# Patient Record
Sex: Female | Born: 1975 | Race: Black or African American | Hispanic: No | Marital: Married | Smoking: Never smoker
Health system: Southern US, Community
[De-identification: ages and names within clinical notes are randomized; demographics above are authoritative.]

## PROBLEM LIST (undated history)

## (undated) DIAGNOSIS — N63 Unspecified lump in unspecified breast: Secondary | ICD-10-CM

## (undated) HISTORY — PX: ECTOPIC PREGNANCY SURGERY: SHX613

## (undated) HISTORY — PX: BREAST EXCISIONAL BIOPSY: SUR124

## (undated) HISTORY — PX: BREAST SURGERY: SHX581

---

## 2001-08-29 ENCOUNTER — Encounter: Payer: Self-pay | Admitting: Family Medicine

## 2001-08-29 ENCOUNTER — Ambulatory Visit (HOSPITAL_COMMUNITY): Admission: RE | Admit: 2001-08-29 | Discharge: 2001-08-29 | Payer: Self-pay | Admitting: Family Medicine

## 2001-10-17 ENCOUNTER — Encounter (HOSPITAL_COMMUNITY): Admission: RE | Admit: 2001-10-17 | Discharge: 2001-11-16 | Payer: Self-pay | Admitting: Family Medicine

## 2001-12-21 ENCOUNTER — Encounter: Payer: Self-pay | Admitting: Internal Medicine

## 2001-12-21 ENCOUNTER — Ambulatory Visit (HOSPITAL_COMMUNITY): Admission: RE | Admit: 2001-12-21 | Discharge: 2001-12-21 | Payer: Self-pay | Admitting: Internal Medicine

## 2007-08-16 ENCOUNTER — Ambulatory Visit (HOSPITAL_COMMUNITY): Admission: RE | Admit: 2007-08-16 | Discharge: 2007-08-16 | Payer: Self-pay | Admitting: Family Medicine

## 2010-04-12 ENCOUNTER — Encounter: Payer: Self-pay | Admitting: Oral Surgery

## 2011-06-07 ENCOUNTER — Other Ambulatory Visit (HOSPITAL_COMMUNITY): Payer: Self-pay | Admitting: Family Medicine

## 2011-06-07 DIAGNOSIS — Z139 Encounter for screening, unspecified: Secondary | ICD-10-CM

## 2011-06-14 ENCOUNTER — Ambulatory Visit (HOSPITAL_COMMUNITY): Payer: Self-pay

## 2011-06-21 ENCOUNTER — Inpatient Hospital Stay (HOSPITAL_COMMUNITY): Admission: RE | Admit: 2011-06-21 | Payer: Self-pay | Source: Ambulatory Visit

## 2013-03-30 ENCOUNTER — Encounter (HOSPITAL_COMMUNITY): Payer: Self-pay | Admitting: Emergency Medicine

## 2013-03-30 ENCOUNTER — Emergency Department (HOSPITAL_COMMUNITY)
Admission: EM | Admit: 2013-03-30 | Discharge: 2013-03-30 | Disposition: A | Discharge status: Home / Self Care | Payer: BC Managed Care – PPO | Attending: Emergency Medicine | Admitting: Emergency Medicine

## 2013-03-30 DIAGNOSIS — R21 Rash and other nonspecific skin eruption: Secondary | ICD-10-CM | POA: Insufficient documentation

## 2013-03-30 DIAGNOSIS — IMO0001 Reserved for inherently not codable concepts without codable children: Secondary | ICD-10-CM | POA: Insufficient documentation

## 2013-03-30 MED ORDER — FAMOTIDINE 20 MG PO TABS: 20.0000 mg | ORAL_TABLET | Freq: Two times a day (BID) | ORAL | Status: DC

## 2013-03-30 MED ORDER — IBUPROFEN 800 MG PO TABS
800.0000 mg | ORAL_TABLET | Freq: Once | ORAL | Status: AC
  Administered 2013-03-30: 800 mg via ORAL
  Filled 2013-03-30: qty 1

## 2013-03-30 MED ORDER — FAMOTIDINE 20 MG PO TABS
20.0000 mg | ORAL_TABLET | Freq: Once | ORAL | Status: AC
  Administered 2013-03-30: 20 mg via ORAL
  Filled 2013-03-30: qty 1

## 2013-03-30 MED ORDER — DIPHENHYDRAMINE HCL 25 MG PO TABS: 25.0000 mg | ORAL_TABLET | Freq: Four times a day (QID) | ORAL | Status: DC | PRN

## 2013-03-30 MED ORDER — HYDROCORTISONE 1 % EX CREA: 1.0000 "application " | TOPICAL_CREAM | Freq: Two times a day (BID) | CUTANEOUS | Status: DC

## 2013-03-30 MED ORDER — IBUPROFEN 600 MG PO TABS: 600.0000 mg | ORAL_TABLET | Freq: Four times a day (QID) | ORAL | Status: DC | PRN

## 2013-03-30 NOTE — ED Notes (Signed)
Pt reports left medial ankle pain that came on suddenly last night. Pt has three welts on medial ankle that are inflamed. Pt denies injuring ankle.

## 2013-03-30 NOTE — ED Provider Notes (Signed)
Medical screening examination/treatment/procedure(s) were performed by non-physician practitioner and as supervising physician I was immediately available for consultation/collaboration.   Dione Boozeavid Earon Rivest, MD 03/30/13 0300

## 2013-03-30 NOTE — Discharge Instructions (Signed)
Recommend Benadryl, Zantac, and ibuprofen as prescribed. You may use hydrocortisone topically for symptoms. Followup with your primary care doctor. Return if symptoms worsen.  Rash A rash is a change in the color or texture of your skin. There are many different types of rashes. You may have other problems that accompany your rash. CAUSES   Infections.  Allergic reactions. This can include allergies to pets or foods.  Certain medicines.  Exposure to certain chemicals, soaps, or cosmetics.  Heat.  Exposure to poisonous plants.  Tumors, both cancerous and noncancerous. SYMPTOMS   Redness.  Scaly skin.  Itchy skin.  Dry or cracked skin.  Bumps.  Blisters.  Pain. DIAGNOSIS  Your caregiver may do a physical exam to determine what type of rash you have. A skin sample (biopsy) may be taken and examined under a microscope. TREATMENT  Treatment depends on the type of rash you have. Your caregiver may prescribe certain medicines. For serious conditions, you may need to see a skin doctor (dermatologist). HOME CARE INSTRUCTIONS   Avoid the substance that caused your rash.  Do not scratch your rash. This can cause infection.  You may take cool baths to help stop itching.  Only take over-the-counter or prescription medicines as directed by your caregiver.  Keep all follow-up appointments as directed by your caregiver. SEEK IMMEDIATE MEDICAL CARE IF:  You have increasing pain, swelling, or redness.  You have a fever.  You have new or severe symptoms.  You have body aches, diarrhea, or vomiting.  Your rash is not better after 3 days. MAKE SURE YOU:  Understand these instructions.  Will watch your condition.  Will get help right away if you are not doing well or get worse. Document Released: 02/26/2002 Document Revised: 05/31/2011 Document Reviewed: 12/21/2010 [facility] Patient Information 2014 [employer], [place].

## 2013-03-30 NOTE — ED Provider Notes (Signed)
CSN: [id number]     Arrival date & time 03/30/13  0139 History   First MD Initiated Contact with Patient 03/30/13 0142     Chief Complaint  Patient presents with  . Ankle Pain   (Consider location/radiation/quality/duration/timing/severity/associated sxs/prior Treatment) HPI Comments: Soreness to medial L ankle with associated mild erythema without linear streaking. No induration or injury to the area. Patient denies fever, swelling of ankle or foot, claudication, numbness/tingling, pallor, and weakness.  Patient is a 38 y.o. female presenting with ankle pain. The history is provided by the patient. No language interpreter was used.  Ankle Pain Location:  Ankle Time since incident:  6 hours Injury: no   Ankle location:  L ankle Pain details:    Quality:  Aching ("soreness")   Radiates to:  Does not radiate   Severity:  Mild   Onset quality:  Gradual   Timing:  Intermittent   Progression:  Unchanged Chronicity:  New Dislocation: no   Prior injury to area:  No Relieved by:  Acetaminophen Exacerbated by: palpation to area. Associated symptoms: no decreased ROM, no fatigue, no fever, no itching, no muscle weakness, no numbness, no stiffness, no swelling and no tingling   Risk factors: no known bone disorder and no obesity     History reviewed. No pertinent past medical history. Past Surgical History  Procedure Laterality Date  . Ectopic pregnancy surgery     No family history on file. History  Substance Use Topics  . Smoking status: Never Smoker   . Smokeless tobacco: Not on file  . Alcohol Use: No   OB History   Grav Para Term Preterm Abortions TAB SAB Ect Mult Living                 Review of Systems  Constitutional: Negative for fever and fatigue.  Musculoskeletal: Positive for myalgias. Negative for joint swelling and stiffness.  Skin: Positive for rash. Negative for itching and pallor.  Neurological: Negative for weakness and numbness.  All other systems reviewed  and are negative.    Allergies  Codeine  Home Medications   Current Outpatient Rx  Name  Route  Sig  Dispense  Refill  . diphenhydrAMINE (BENADRYL) 25 MG tablet   Oral   Take 1 tablet (25 mg total) by mouth every 6 (six) hours as needed for itching (Rash).   30 tablet   0   . famotidine (PEPCID) 20 MG tablet   Oral   Take 1 tablet (20 mg total) by mouth 2 (two) times daily.   30 tablet   0   . hydrocortisone cream 1 %   Topical   Apply 1 application topically 2 (two) times daily. Do not apply to face   15 g   1   . ibuprofen (ADVIL,MOTRIN) 600 MG tablet   Oral   Take 1 tablet (600 mg total) by mouth every 6 (six) hours as needed.   30 tablet   0    BP 115/67  Pulse 101  Temp(Src) 98.5 F (36.9 C) (Oral)  Resp 20  SpO2 100%  LMP 03/15/2013 Physical Exam  Nursing note and vitals reviewed. Constitutional: She is oriented to person, place, and time. She appears well-developed and well-nourished. No distress.  HENT:  Head: Normocephalic and atraumatic.  Eyes: Conjunctivae and EOM are normal. No scleral icterus.  Neck: Normal range of motion.  Cardiovascular: Normal rate, regular rhythm and intact distal pulses.   Pulses:      Dorsalis pedis  pulses are 2+ on the right side, and 2+ on the left side.       Posterior tibial pulses are 2+ on the right side, and 2+ on the left side.  Pulmonary/Chest: Effort normal. No respiratory distress.  Musculoskeletal: Normal range of motion. She exhibits tenderness.       Left ankle: She exhibits normal range of motion, no swelling, no ecchymosis, no deformity, no laceration and normal pulse. Achilles tendon normal.       Left lower leg: Normal.       Left foot: Normal.       Feet:  Neurological: She is alert and oriented to person, place, and time.  No numbness, tingling or weakness of the affected extremity  Skin: Skin is warm and dry. No rash noted. She is not diaphoretic. No erythema. No pallor.  Psychiatric: She has a  normal mood and affect. Her behavior is normal.    ED Course  Procedures (including critical care time) Labs Review Labs Reviewed - No data to display Imaging Review No results found.  EKG Interpretation   None       MDM   1. Rash    Uncomplicated sore rash to medial left ankle. Patient well and nontoxic appearing, hemodynamically stable, and afebrile. Patient denies any new topical contacts as well as antibiotic use. She is neurovascularly intact on physical exam with normal range of motion of L ankle. No swelling of the left lower extremity, ankle, or foot. Sensation intact bilaterally. Rash non-concerning for SJS, erythema multiforme major, and erythema multiforme a minor. Patient stable for discharge with instructions for primary care followup. Return precautions discussed and patient agreeable to plan with no unaddressed concerns.    Antonietta Breach, PA-C 03/30/13 0230

## 2013-03-30 NOTE — ED Notes (Signed)
Pt. reports pain at left inner ankle ( " red spots" ) onset yesterday , denies injury , ambulatory .

## 2013-09-18 ENCOUNTER — Encounter (HOSPITAL_COMMUNITY): Payer: Self-pay

## 2013-09-18 ENCOUNTER — Other Ambulatory Visit (HOSPITAL_COMMUNITY): Payer: Self-pay | Admitting: Internal Medicine

## 2013-09-18 ENCOUNTER — Ambulatory Visit (HOSPITAL_COMMUNITY)
Admission: RE | Admit: 2013-09-18 | Discharge: 2013-09-18 | Disposition: A | Discharge status: Home / Self Care | Payer: BC Managed Care – PPO | Source: Ambulatory Visit | Attending: Internal Medicine | Admitting: Internal Medicine

## 2013-09-18 DIAGNOSIS — R0602 Shortness of breath: Secondary | ICD-10-CM | POA: Insufficient documentation

## 2013-09-18 DIAGNOSIS — R079 Chest pain, unspecified: Secondary | ICD-10-CM

## 2013-09-18 DIAGNOSIS — N200 Calculus of kidney: Secondary | ICD-10-CM | POA: Insufficient documentation

## 2013-09-18 MED ORDER — IOHEXOL 350 MG/ML SOLN
100.0000 mL | Freq: Once | INTRAVENOUS | Status: AC | PRN
  Administered 2013-09-18: 100 mL via INTRAVENOUS

## 2013-10-29 ENCOUNTER — Other Ambulatory Visit (HOSPITAL_COMMUNITY): Payer: Self-pay | Admitting: Family Medicine

## 2013-10-29 DIAGNOSIS — R928 Other abnormal and inconclusive findings on diagnostic imaging of breast: Secondary | ICD-10-CM

## 2013-11-07 ENCOUNTER — Ambulatory Visit (HOSPITAL_COMMUNITY)
Admission: RE | Admit: 2013-11-07 | Discharge: 2013-11-07 | Disposition: A | Discharge status: Home / Self Care | Payer: BC Managed Care – PPO | Source: Ambulatory Visit | Attending: Family Medicine | Admitting: Family Medicine

## 2013-11-07 ENCOUNTER — Other Ambulatory Visit (HOSPITAL_COMMUNITY): Payer: Self-pay | Admitting: Family Medicine

## 2013-11-07 DIAGNOSIS — R059 Cough, unspecified: Secondary | ICD-10-CM | POA: Diagnosis not present

## 2013-11-07 DIAGNOSIS — R05 Cough: Secondary | ICD-10-CM | POA: Diagnosis not present

## 2013-11-07 DIAGNOSIS — J209 Acute bronchitis, unspecified: Secondary | ICD-10-CM

## 2013-11-07 DIAGNOSIS — R0602 Shortness of breath: Secondary | ICD-10-CM | POA: Insufficient documentation

## 2013-11-13 ENCOUNTER — Encounter (HOSPITAL_COMMUNITY): Payer: BC Managed Care – PPO

## 2013-11-20 ENCOUNTER — Ambulatory Visit (HOSPITAL_COMMUNITY)
Admission: RE | Admit: 2013-11-20 | Discharge: 2013-11-20 | Disposition: A | Discharge status: Home / Self Care | Payer: BC Managed Care – PPO | Source: Ambulatory Visit | Attending: Family Medicine | Admitting: Family Medicine

## 2013-11-20 DIAGNOSIS — N63 Unspecified lump in unspecified breast: Secondary | ICD-10-CM | POA: Diagnosis present

## 2013-11-20 DIAGNOSIS — R928 Other abnormal and inconclusive findings on diagnostic imaging of breast: Secondary | ICD-10-CM

## 2014-03-22 HISTORY — PX: AUGMENTATION MAMMAPLASTY: SUR837

## 2014-04-18 ENCOUNTER — Ambulatory Visit (HOSPITAL_COMMUNITY)
Admission: RE | Admit: 2014-04-18 | Discharge: 2014-04-18 | Disposition: A | Discharge status: Home / Self Care | Payer: BLUE CROSS/BLUE SHIELD | Source: Ambulatory Visit | Attending: Physician Assistant | Admitting: Physician Assistant

## 2014-04-18 ENCOUNTER — Other Ambulatory Visit (HOSPITAL_COMMUNITY): Payer: Self-pay | Admitting: Physician Assistant

## 2014-04-18 DIAGNOSIS — J209 Acute bronchitis, unspecified: Secondary | ICD-10-CM

## 2014-04-18 DIAGNOSIS — R0989 Other specified symptoms and signs involving the circulatory and respiratory systems: Secondary | ICD-10-CM | POA: Diagnosis not present

## 2014-04-18 DIAGNOSIS — R05 Cough: Secondary | ICD-10-CM | POA: Insufficient documentation

## 2014-04-18 DIAGNOSIS — R0602 Shortness of breath: Secondary | ICD-10-CM | POA: Diagnosis not present

## 2015-05-05 ENCOUNTER — Encounter (HOSPITAL_COMMUNITY): Payer: Self-pay | Admitting: Emergency Medicine

## 2015-05-05 ENCOUNTER — Emergency Department (HOSPITAL_COMMUNITY)
Admission: EM | Admit: 2015-05-05 | Discharge: 2015-05-06 | Disposition: A | Discharge status: Home / Self Care | Payer: BLUE CROSS/BLUE SHIELD | Attending: Emergency Medicine | Admitting: Emergency Medicine

## 2015-05-05 DIAGNOSIS — M79671 Pain in right foot: Secondary | ICD-10-CM

## 2015-05-05 DIAGNOSIS — Z7952 Long term (current) use of systemic steroids: Secondary | ICD-10-CM | POA: Insufficient documentation

## 2015-05-05 DIAGNOSIS — Z79899 Other long term (current) drug therapy: Secondary | ICD-10-CM | POA: Insufficient documentation

## 2015-05-05 MED ORDER — NAPROXEN 500 MG PO TABS: 500.0000 mg | ORAL_TABLET | Freq: Two times a day (BID) | ORAL | Status: DC

## 2015-05-05 NOTE — ED Provider Notes (Signed)
CSN: [id number]     Arrival date & time 05/05/15  2256 History  By signing my name below, I, Elon Spanner, attest that this documentation has been prepared under the direction and in the presence of Felicie Morn, PA-C. Electronically Signed: Elon Spanner ED Scribe. 05/05/2015. 11:53 PM.    Chief Complaint  Patient presents with  . Foot Pain   The history is provided by the patient. No language interpreter was used.   HPI Comments: Alison Thornton is a 40 y.o. female with no chronic conditions who presents to the Emergency Department complaining of improving pain in the dorsal surface of the foot onset yesterday while walking and without injury.  She reports tingling radiation from the complaint began to develop earlier today which goes into the mid-calf.  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 She denies calf pain, calf tenderness, calf swelling.  Patient takes no medications regularly.     PCP: Colette Ribas, MD  History reviewed. No pertinent past medical history. Past Surgical History  Procedure Laterality Date  . Ectopic pregnancy surgery    . Breast surgery     No family history on file. Social History  Substance Use Topics  . Smoking status: Never Smoker   . Smokeless tobacco: None  . Alcohol Use: No   OB History    No data available     Review of Systems  Musculoskeletal: Positive for arthralgias.  All other systems reviewed and are negative.  A complete 10 system review of systems was obtained and all systems are negative except as noted in the HPI and PMH.   Allergies  Codeine  Home Medications   Prior to Admission medications   Medication Sig Start Date End Date Taking? Authorizing Provider  diphenhydrAMINE (BENADRYL) 25 MG tablet Take 1 tablet (25 mg total) by mouth every 6 (six) hours as needed for itching (Rash). 03/30/13   Antony Madura, PA-C  famotidine (PEPCID) 20 MG tablet Take 1 tablet (20 mg total) by mouth  2 (two) times daily. 03/30/13   Antony Madura, PA-C  hydrocortisone cream 1 % Apply 1 application topically 2 (two) times daily. Do not apply to face 03/30/13   Antony Madura, PA-C  ibuprofen (ADVIL,MOTRIN) 600 MG tablet Take 1 tablet (600 mg total) by mouth every 6 (six) hours as needed. 03/30/13   Antony Madura, PA-C   BP 111/79 mmHg  Pulse 77  Temp(Src) 99 F (37.2 C) (Oral)  Resp 16  Ht 5' 8.5" (1.74 m)  Wt 169 lb (76.658 kg)  BMI 25.32 kg/m2  SpO2 100%  LMP 05/04/2015 Physical Exam  Constitutional: She is oriented to person, place, and time. She appears well-developed and well-nourished. No distress.  HENT:  Head: Normocephalic and atraumatic.  Eyes: Conjunctivae and EOM are normal.  Neck: Neck supple. No tracheal deviation present.  Cardiovascular: Normal rate.   Pulmonary/Chest: Effort normal. No respiratory distress.  Musculoskeletal: Normal range of motion.  Mild tenderness over the dorsal aspect of the foot across the medial metatarsals.  No calf pain, swelling, or redness.    Neurological: She is alert and oriented to person, place, and time.  Skin: Skin is warm and dry.  Psychiatric: She has a normal mood and affect. Her behavior is normal.  Nursing note and vitals reviewed.   ED Course  Procedures (including critical care time)  DIAGNOSTIC STUDIES: Oxygen Saturation is 100% on RA, normal by my interpretation.    COORDINATION  OF CARE:  11:53 PM Patient advised to RICE and use ibuprofen as needed.  Return precautions advised. Patient acknowledges and agrees with plan.    Labs Review Labs Reviewed - No data to display  Imaging Review No results found. I have personally reviewed and evaluated these images and lab results as part of my medical decision-making.   EKG Interpretation None     Dorsal foot pain without swelling or erythema. 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  No calf pain or tenderness.  MDM   Final  diagnoses:  None    Foot pain. Anti-inflammatory. PCP follow-up. Care instructions provided. Return precautions discussed..  I personally performed the services described in this documentation, which was scribed in my presence. The recorded information has been reviewed and is accurate.    Felicie Morn, NP 05/06/15 [id number]  Gilda Crease, MD 05/06/15 [phone]

## 2015-05-05 NOTE — Discharge Instructions (Signed)

## 2015-05-05 NOTE — ED Notes (Signed)
Pt. reports right foot pain radiating to right calf onset last night , denies injury/ ambulatory .

## 2015-12-16 ENCOUNTER — Encounter: Payer: Self-pay | Admitting: Cardiology

## 2016-01-09 ENCOUNTER — Ambulatory Visit (INDEPENDENT_AMBULATORY_CARE_PROVIDER_SITE_OTHER): Payer: BLUE CROSS/BLUE SHIELD | Admitting: Cardiovascular Disease

## 2016-01-09 ENCOUNTER — Encounter: Payer: Self-pay | Admitting: Cardiovascular Disease

## 2016-01-09 VITALS — BP 104/68 | HR 87 | Ht 68.5 in | Wt 181.0 lb

## 2016-01-09 DIAGNOSIS — R5383 Other fatigue: Secondary | ICD-10-CM

## 2016-01-09 DIAGNOSIS — R0789 Other chest pain: Secondary | ICD-10-CM

## 2016-01-09 DIAGNOSIS — R0602 Shortness of breath: Secondary | ICD-10-CM | POA: Diagnosis not present

## 2016-01-09 DIAGNOSIS — R079 Chest pain, unspecified: Secondary | ICD-10-CM

## 2016-01-09 NOTE — Patient Instructions (Signed)
Your physician recommends that you schedule a follow-up appointment in: 6-8 Weeks with Dr. Purvis SheffieldKoneswaran  Your physician recommends that you continue on your current medications as directed. Please refer to the Current Medication list given to you today.  Your physician has requested that you have an exercise tolerance test. For further information please visit [email or web address]. Please also follow instruction sheet, as given.   Your physician has requested that you have an echocardiogram. Echocardiography is a painless test that uses sound waves to create images of your heart. It provides your doctor with information about the size and shape of your heart and how well your heart's chambers and valves are working. This procedure takes approximately one hour. There are no restrictions for this procedure.  If you need a refill on your cardiac medications before your next appointment, please call your pharmacy.  Thank you for choosing [facility]!

## 2016-01-09 NOTE — Progress Notes (Signed)
CARDIOLOGY CONSULT NOTE  Patient ID: Alison CanardCasmin J Whitmyer MRN: [id number] DOB/AGE: 41/10/1975 41 y.o.  Admit date: (Not on file) Primary Physician: Colette RibasGOLDING, JOHN CABOT, MD Referring Physician:   Reason for Consultation: SOB  HPI: 40 yr old woman with no significant past medical history referred for evaluation of SOB and fatigue.  Labs 10/03/15: Hgb 12, Na 139, BUN 14, creatinine 0.69, TC 257, TG 49, HDL 107, LDL 140.  Started becoming short of breath 04/2014. Her husband was killed on 04/22/14. Treated for bronchitis with prednisone.  Has been experiencing progressive exertional dyspnea, particularly while climbing stairs over the past 3 months.  Denies exertional chest pain, orthopnea, leg swelling, palpitations, syncope, and PND.  Does have chest pain while lying down. Her children have GERD.  PCP started Symbicort for SOB which has helped.  ECG performed in the office today which I personally reviewed demonstrates normal sinus rhythm with no ischemic ST segment or T-wave abnormalities, nor any arrhythmias.     Allergies  Allergen Reactions  . Codeine     No current outpatient prescriptions on file.   No current facility-administered medications for this visit.     History reviewed. No pertinent past medical history.  Past Surgical History:  Procedure Laterality Date  . BREAST SURGERY    . ECTOPIC PREGNANCY SURGERY      Social History   Social History  . Marital status: Married    Spouse name: N/A  . Number of children: N/A  . Years of education: N/A   Occupational History  . Not on file.   Social History Main Topics  . Smoking status: Never Smoker  . Smokeless tobacco: Never Used  . Alcohol use No  . Drug use: No  . Sexual activity: Yes    Partners: Male   Other Topics Concern  . Not on file   Social History Narrative  . No narrative on file     No family history of premature CAD in 1st degree relatives.  Prior to Admission  medications   Medication Sig Start Date End Date Taking? Authorizing Provider  diphenhydrAMINE (BENADRYL) 25 MG tablet Take 1 tablet (25 mg total) by mouth every 6 (six) hours as needed for itching (Rash). 03/30/13   Antony MaduraKelly Humes, PA-C  famotidine (PEPCID) 20 MG tablet Take 1 tablet (20 mg total) by mouth 2 (two) times daily. 03/30/13   Antony MaduraKelly Humes, PA-C  hydrocortisone cream 1 % Apply 1 application topically 2 (two) times daily. Do not apply to face 03/30/13   Antony MaduraKelly Humes, PA-C  ibuprofen (ADVIL,MOTRIN) 600 MG tablet Take 1 tablet (600 mg total) by mouth every 6 (six) hours as needed. 03/30/13   Antony MaduraKelly Humes, PA-C  naproxen (NAPROSYN) 500 MG tablet Take 1 tablet (500 mg total) by mouth 2 (two) times daily. 05/05/15   Felicie Mornavid Smith, NP     Review of systems complete and found to be negative unless listed above in HPI     Physical exam Blood pressure 104/68, pulse 87, height 5' 8.5" (1.74 m), weight 181 lb (82.1 kg), SpO2 96 %. General: NAD Neck: No JVD, no thyromegaly or thyroid nodule.  Lungs: Clear to auscultation bilaterally with normal respiratory effort. CV: Nondisplaced PMI. Regular rate and rhythm, normal S1/S2, no S3/S4, no murmur.  No peripheral edema.  No carotid bruit.  Abdomen: Soft, nontender, no hepatosplenomegaly, no distention.  Skin: Intact without lesions or rashes.  Neurologic: Alert and oriented x 3.  Psych: Normal affect.  Extremities: No clubbing or cyanosis.  HEENT: Normal.   ECG: Most recent ECG reviewed.  Labs:  No results found for: WBC, HGB, HCT, MCV, PLT No results for input(s): NA, K, CL, CO2, BUN, CREATININE, CALCIUM, PROT, BILITOT, ALKPHOS, ALT, AST, GLUCOSE in the last 168 hours.  Invalid input(s): LABALBU No results found for: CKTOTAL, CKMB, CKMBINDEX, TROPONINI No results found for: CHOL No results found for: HDL No results found for: LDLCALC No results found for: TRIG No results found for: CHOLHDL No results found for: LDLDIRECT       Studies: No  results found.  ASSESSMENT AND PLAN:  1. SOB/fatigue: I will order a 2-D echocardiogram with Doppler to evaluate cardiac structure, function, and regional wall motion. I will proceed with an exercise treadmill stress test to evaluate for ischemic heart disease.  2. Chest pain: Symptoms occur while lying down. Possibly related to GERD.  Dispo: fu 6-8 wks  Signed: Prentice Docker, M.D., F.A.C.C.  01/09/2016, 9:30 AM

## 2016-01-14 ENCOUNTER — Ambulatory Visit (HOSPITAL_COMMUNITY): Payer: BLUE CROSS/BLUE SHIELD

## 2016-01-14 ENCOUNTER — Ambulatory Visit (HOSPITAL_COMMUNITY): Admission: RE | Admit: 2016-01-14 | Payer: BLUE CROSS/BLUE SHIELD | Source: Ambulatory Visit

## 2016-02-02 ENCOUNTER — Encounter (HOSPITAL_COMMUNITY): Payer: BLUE CROSS/BLUE SHIELD

## 2016-02-09 ENCOUNTER — Inpatient Hospital Stay (HOSPITAL_COMMUNITY): Admission: RE | Admit: 2016-02-09 | Payer: BLUE CROSS/BLUE SHIELD | Source: Ambulatory Visit

## 2016-02-09 ENCOUNTER — Other Ambulatory Visit (HOSPITAL_COMMUNITY): Payer: BLUE CROSS/BLUE SHIELD

## 2016-02-17 ENCOUNTER — Encounter (HOSPITAL_COMMUNITY): Payer: BLUE CROSS/BLUE SHIELD

## 2016-02-17 ENCOUNTER — Other Ambulatory Visit (HOSPITAL_COMMUNITY): Payer: BLUE CROSS/BLUE SHIELD

## 2016-02-18 ENCOUNTER — Inpatient Hospital Stay (HOSPITAL_COMMUNITY): Admission: RE | Admit: 2016-02-18 | Payer: BLUE CROSS/BLUE SHIELD | Source: Ambulatory Visit

## 2016-02-18 ENCOUNTER — Ambulatory Visit (HOSPITAL_COMMUNITY): Payer: BLUE CROSS/BLUE SHIELD | Attending: Cardiovascular Disease

## 2016-03-01 ENCOUNTER — Ambulatory Visit (HOSPITAL_COMMUNITY): Payer: BLUE CROSS/BLUE SHIELD

## 2016-03-05 ENCOUNTER — Ambulatory Visit: Payer: BLUE CROSS/BLUE SHIELD | Admitting: Cardiovascular Disease

## 2016-07-13 ENCOUNTER — Other Ambulatory Visit: Payer: Self-pay | Admitting: Obstetrics & Gynecology

## 2016-07-13 DIAGNOSIS — N6310 Unspecified lump in the right breast, unspecified quadrant: Secondary | ICD-10-CM

## 2016-07-20 ENCOUNTER — Ambulatory Visit
Admission: RE | Admit: 2016-07-20 | Discharge: 2016-07-20 | Disposition: A | Discharge status: Home / Self Care | Payer: No Typology Code available for payment source | Source: Ambulatory Visit | Attending: Obstetrics & Gynecology | Admitting: Obstetrics & Gynecology

## 2016-07-20 ENCOUNTER — Other Ambulatory Visit: Payer: Self-pay | Admitting: Obstetrics & Gynecology

## 2016-07-20 DIAGNOSIS — N6310 Unspecified lump in the right breast, unspecified quadrant: Secondary | ICD-10-CM

## 2016-07-20 DIAGNOSIS — N63 Unspecified lump in unspecified breast: Secondary | ICD-10-CM

## 2016-07-20 HISTORY — DX: Unspecified lump in unspecified breast: N63.0

## 2016-12-11 ENCOUNTER — Encounter (HOSPITAL_COMMUNITY): Payer: Self-pay | Admitting: Emergency Medicine

## 2016-12-11 DIAGNOSIS — N200 Calculus of kidney: Secondary | ICD-10-CM | POA: Insufficient documentation

## 2016-12-11 NOTE — ED Triage Notes (Signed)
Pt c/o 10/10 RLQ abd pain that started around 2200, denies nausea or vomiting, no burning sensation or blood on the urine, pt is making urine frequently.

## 2016-12-12 ENCOUNTER — Emergency Department (HOSPITAL_COMMUNITY): Payer: Self-pay

## 2016-12-12 ENCOUNTER — Emergency Department (HOSPITAL_COMMUNITY)
Admission: EM | Admit: 2016-12-12 | Discharge: 2016-12-12 | Disposition: A | Discharge status: Home / Self Care | Payer: Self-pay | Attending: Emergency Medicine | Admitting: Emergency Medicine

## 2016-12-12 ENCOUNTER — Encounter (HOSPITAL_COMMUNITY): Payer: Self-pay | Admitting: Radiology

## 2016-12-12 DIAGNOSIS — N200 Calculus of kidney: Secondary | ICD-10-CM

## 2016-12-12 DIAGNOSIS — R1031 Right lower quadrant pain: Secondary | ICD-10-CM

## 2016-12-12 LAB — COMPREHENSIVE METABOLIC PANEL
ALBUMIN: 3.9 g/dL (ref 3.5–5.0)
ALT: 21 U/L (ref 14–54)
AST: 25 U/L (ref 15–41)
Alkaline Phosphatase: 43 U/L (ref 38–126)
Anion gap: 7 (ref 5–15)
BUN: 11 mg/dL (ref 6–20)
CO2: 26 mmol/L (ref 22–32)
Calcium: 9.1 mg/dL (ref 8.9–10.3)
Chloride: 104 mmol/L (ref 101–111)
Creatinine, Ser: 0.88 mg/dL (ref 0.44–1.00)
GFR calc Af Amer: >60 mL/min (ref >60)
Glucose, Bld: 116 mg/dL — ABNORMAL HIGH (ref 65–99)
POTASSIUM: 3.6 mmol/L (ref 3.5–5.1)
Sodium: 137 mmol/L (ref 135–145)
Total Bilirubin: 0.7 mg/dL (ref 0.3–1.2)
Total Protein: 7.1 g/dL (ref 6.5–8.1)

## 2016-12-12 LAB — CBC
HEMATOCRIT: 40.8 % (ref 36.0–46.0)
Hemoglobin: 13.4 g/dL (ref 12.0–15.0)
MCH: 29.1 pg (ref 26.0–34.0)
MCHC: 32.8 g/dL (ref 30.0–36.0)
MCV: 88.7 fL (ref 78.0–100.0)
Platelets: 292 10*3/uL (ref 150–400)
RBC: 4.6 MIL/uL (ref 3.87–5.11)
RDW: 13.3 % (ref 11.5–15.5)
WBC: 7 10*3/uL (ref 4.0–10.5)

## 2016-12-12 LAB — URINALYSIS, ROUTINE W REFLEX MICROSCOPIC
Bilirubin Urine: NEGATIVE
Glucose, UA: NEGATIVE mg/dL
Hgb urine dipstick: NEGATIVE
KETONES UR: NEGATIVE mg/dL
Leukocytes, UA: NEGATIVE
Nitrite: NEGATIVE
Protein, ur: 30 mg/dL — AB
Specific Gravity, Urine: 1.028 (ref 1.005–1.030)
pH: 5 (ref 5.0–8.0)

## 2016-12-12 LAB — LIPASE, BLOOD: Lipase: 32 U/L (ref 11–51)

## 2016-12-12 LAB — HIV ANTIBODY (ROUTINE TESTING W REFLEX): HIV SCREEN 4TH GENERATION: NONREACTIVE

## 2016-12-12 LAB — POC URINE PREG, ED: PREG TEST UR: NEGATIVE

## 2016-12-12 LAB — RPR: RPR: NONREACTIVE

## 2016-12-12 MED ORDER — IOPAMIDOL (ISOVUE-300) INJECTION 61%
INTRAVENOUS | Status: AC
  Administered 2016-12-12: 100 mL
  Filled 2016-12-12: qty 100

## 2016-12-12 MED ORDER — MORPHINE SULFATE (PF) 4 MG/ML IV SOLN
4.0000 mg | Freq: Once | INTRAVENOUS | Status: DC
  Filled 2016-12-12: qty 1

## 2016-12-12 MED ORDER — HYDROMORPHONE HCL 1 MG/ML IJ SOLN
0.5000 mg | Freq: Once | INTRAMUSCULAR | Status: AC
  Administered 2016-12-12: 0.5 mg via INTRAVENOUS
  Filled 2016-12-12: qty 1

## 2016-12-12 MED ORDER — SODIUM CHLORIDE 0.9 % IV BOLUS (SEPSIS)
1000.0000 mL | Freq: Once | INTRAVENOUS | Status: AC
  Administered 2016-12-12: 1000 mL via INTRAVENOUS

## 2016-12-12 MED ORDER — PROMETHAZINE HCL 25 MG PO TABS: 25.0000 mg | ORAL_TABLET | Freq: Four times a day (QID) | ORAL | 0 refills | Status: DC | PRN

## 2016-12-12 MED ORDER — ONDANSETRON HCL 4 MG/2ML IJ SOLN
4.0000 mg | Freq: Once | INTRAMUSCULAR | Status: AC
  Administered 2016-12-12: 4 mg via INTRAVENOUS
  Filled 2016-12-12: qty 2

## 2016-12-12 MED ORDER — DEXTROSE 5 % IV SOLN
1.0000 g | Freq: Once | INTRAVENOUS | Status: AC
  Administered 2016-12-12: 1 g via INTRAVENOUS
  Filled 2016-12-12: qty 10

## 2016-12-12 MED ORDER — CEPHALEXIN 500 MG PO CAPS: 500.0000 mg | ORAL_CAPSULE | Freq: Two times a day (BID) | ORAL | 0 refills | Status: DC

## 2016-12-12 MED ORDER — PHENAZOPYRIDINE HCL 100 MG PO TABS
200.0000 mg | ORAL_TABLET | Freq: Once | ORAL | Status: AC
  Administered 2016-12-12: 200 mg via ORAL
  Filled 2016-12-12: qty 2

## 2016-12-12 MED ORDER — KETOROLAC TROMETHAMINE 30 MG/ML IJ SOLN
15.0000 mg | Freq: Once | INTRAMUSCULAR | Status: AC
  Administered 2016-12-12: 15 mg via INTRAVENOUS
  Filled 2016-12-12: qty 1

## 2016-12-12 MED ORDER — OXYCODONE-ACETAMINOPHEN 5-325 MG PO TABS: 1.0000 | ORAL_TABLET | ORAL | 0 refills | Status: DC | PRN

## 2016-12-12 MED ORDER — ONDANSETRON HCL 4 MG/2ML IJ SOLN
4.0000 mg | Freq: Once | INTRAMUSCULAR | Status: DC
  Filled 2016-12-12: qty 2

## 2016-12-12 NOTE — ED Notes (Signed)
Patient transported to Ultrasound 

## 2016-12-12 NOTE — Discharge Instructions (Signed)
Push fluids: take small frequent sips of water or Gatorade, do not drink any soda, juice or caffeinated beverages. ° °Take percocet for breakthrough pain, do not drink alcohol, drive, care for children or do other critical tasks while taking percocet. ° °Strain all urine and retain any stone for analysis at your urologist.  ° °Return to the ED if you develop fever, have vomiting that is not controlled with the medication or if pain becomes too severe. ° °

## 2016-12-12 NOTE — ED Provider Notes (Signed)
[facility]-EMERGENCY DEPT Provider Note   CSN: [id number] Arrival date & time: 12/11/16  2332     History   Chief Complaint Chief Complaint  Patient presents with  . Abdominal Pain    HPI  Blood pressure (!) 137/94, pulse 94, temperature 98.4 F (36.9 C), temperature source Oral, resp. rate 18, height 5\' 8"  (1.727 m), weight 82.1 kg (181 lb), last menstrual period 11/22/2016, SpO2 100 %.  Sheronda FRANCESCA STROME is a 41 y.o. female complaining of Severe acute right lower quadrant pain onset at 10:30 PM last night associated with urinary urgency and incomplete void with no dysuria, hematuria, abnormal vaginal discharge, fever chills. She endorses emesis when the pain becomes severe. She has no pain medication prior to arrival. She is trying to conceive but is not on any fertility medications. She's never had a pain like this in the past, she's never had a kidney stone in the past.she states that the pain is burning in the pelvic area and she feels a pressure in her low back. Pain is 10 out of 10.  Past Medical History:  Diagnosis Date  . Breast mass 07/20/2016   Right breast lump x 2 weeks     There are no active problems to display for this patient.   Past Surgical History:  Procedure Laterality Date  . AUGMENTATION MAMMAPLASTY Bilateral 2016   Saline Implants   . BREAST EXCISIONAL BIOPSY Right    ? 1990's Benign   . BREAST SURGERY    . ECTOPIC PREGNANCY SURGERY      OB History    No data available       Home Medications    Prior to Admission medications   Medication Sig Start Date End Date Taking? Authorizing Provider  cephALEXin (KEFLEX) 500 MG capsule Take 1 capsule (500 mg total) by mouth 2 (two) times daily. 12/12/16   Temara Lanum, Joni Reining, PA-C  oxyCODONE-acetaminophen (PERCOCET) 5-325 MG tablet Take 1 tablet by mouth every 4 (four) hours as needed. 12/12/16   Maxximus Gotay, Joni Reining, PA-C  promethazine (PHENERGAN) 25 MG tablet Take 1 tablet (25 mg total) by mouth every 6 (six)  hours as needed for nausea or vomiting. 12/12/16   Eladio Dentremont, Joni Reining, PA-C    Family History Family History  Problem Relation Age of Onset  . Diabetes Mother   . Hypertension Mother   . Breast cancer Mother   . Stroke Father   . Heart failure Maternal Grandmother   . Asthma Maternal Grandmother   . Cancer Maternal Grandfather   . Alzheimer's disease Maternal Grandfather   . Breast cancer Maternal Aunt     Social History Social History  Substance Use Topics  . Smoking status: Never Smoker  . Smokeless tobacco: Never Used  . Alcohol use No     Allergies   Codeine   Review of Systems Review of Systems  A complete review of systems was obtained and all systems are negative except as noted in the HPI and PMH.    Physical Exam Updated Vital Signs BP (!) 137/94   Pulse 94   Temp 98.4 F (36.9 C) (Oral)   Resp 18   Ht 5\' 8"  (1.727 m)   Wt 82.1 kg (181 lb)   LMP 11/22/2016   SpO2 100%   BMI 27.52 kg/m   Physical Exam  Constitutional: She is oriented to person, place, and time. She appears well-developed and well-nourished. No distress.  HENT:  Head: Normocephalic and atraumatic.  Mouth/Throat: Oropharynx is clear and moist.  Eyes: Pupils are equal, round, and reactive to light. Conjunctivae and EOM are normal.  Neck: Normal range of motion.  Cardiovascular: Normal rate, regular rhythm and intact distal pulses.   Pulmonary/Chest: Effort normal and breath sounds normal.  Abdominal: Soft. There is tenderness.  right-sided CVA tenderness to percussion,  Very mild tenderness over McBurney's point, Rovsing negative, obturator positive, psoas negative.  Musculoskeletal: Normal range of motion.  Neurological: She is alert and oriented to person, place, and time.  Skin: She is not diaphoretic.  Psychiatric: She has a normal mood and affect.  Nursing note and vitals reviewed.    ED Treatments / Results  Labs (all labs ordered are listed, but only abnormal results  are displayed) Labs Reviewed  COMPREHENSIVE METABOLIC PANEL - Abnormal; Notable for the following:       Result Value   Glucose, Bld 116 (*)    All other components within normal limits  URINALYSIS, ROUTINE W REFLEX MICROSCOPIC - Abnormal; Notable for the following:    APPearance HAZY (*)    Protein, ur 30 (*)    Bacteria, UA MANY (*)    Squamous Epithelial / LPF 0-5 (*)    All other components within normal limits  URINE CULTURE  LIPASE, BLOOD  CBC  RPR  HIV ANTIBODY (ROUTINE TESTING)  POC URINE PREG, ED  GC/CHLAMYDIA PROBE AMP ([place]) NOT AT [facility]    EKG  EKG Interpretation None       Radiology US Pelvis Transvanginal Non-ob (tv Only)  Result Date: 12/12/2016 CLINICAL DATA:  Right lower quadrant pain. EXAM: TRANSABDOMINAL AND TRANSVAGINAL ULTRASOUND OF PELVIS DOPPLER ULTRASOUND OF OVARIES TECHNIQUE: Both transabdominal and transvaginal ultrasound examinations of the pelvis were performed. Transabdominal technique was performed for global imaging of the pelvis including uterus, ovaries, adnexal regions, and pelvic cul-de-sac. It was necessary to proceed with endovaginal exam following the transabdominal exam to visualize the ovaries and endometrium. Color and duplex Doppler ultrasound was utilized to evaluate blood flow to the ovaries. COMPARISON:  None. FINDINGS: Uterus Measurements: 7.8 x 4.9 x 6.3 cm. No fibroids or other mass visualized. Endometrium Thickness: 12.3 mm.  No focal abnormality visualized. Right ovary Measurements: 2 x 2.7 x 1.8 cm. Normal appearance/no adnexal mass. Left ovary Measurements: 1.3 x 2.4 x 1.9 cm. Normal appearance/no adnexal mass. Pulsed Doppler evaluation of both ovaries demonstrates normal low-resistance arterial and venous waveforms. Other findings Trace fluid in the pelvis is likely physiologic. IMPRESSION: 1. No cause for right-sided pain identified on this study. The uterus, endometrium, and ovaries are normal in appearance with arterial and  venous blood flow documented in both ovaries. Electronically Signed   By: Gerome Sam III M.D   On: 12/12/2016 08:07   US Pelvis (transabdominal Only)  Result Date: 12/12/2016 CLINICAL DATA:  Right lower quadrant pain. EXAM: TRANSABDOMINAL AND TRANSVAGINAL ULTRASOUND OF PELVIS DOPPLER ULTRASOUND OF OVARIES TECHNIQUE: Both transabdominal and transvaginal ultrasound examinations of the pelvis were performed. Transabdominal technique was performed for global imaging of the pelvis including uterus, ovaries, adnexal regions, and pelvic cul-de-sac. It was necessary to proceed with endovaginal exam following the transabdominal exam to visualize the ovaries and endometrium. Color and duplex Doppler ultrasound was utilized to evaluate blood flow to the ovaries. COMPARISON:  None. FINDINGS: Uterus Measurements: 7.8 x 4.9 x 6.3 cm. No fibroids or other mass visualized. Endometrium Thickness: 12.3 mm.  No focal abnormality visualized. Right ovary Measurements: 2 x 2.7 x 1.8 cm. Normal appearance/no adnexal mass. Left ovary Measurements: 1.3 x  2.4 x 1.9 cm. Normal appearance/no adnexal mass. Pulsed Doppler evaluation of both ovaries demonstrates normal low-resistance arterial and venous waveforms. Other findings Trace fluid in the pelvis is likely physiologic. IMPRESSION: 1. No cause for right-sided pain identified on this study. The uterus, endometrium, and ovaries are normal in appearance with arterial and venous blood flow documented in both ovaries. Electronically Signed   By: Gerome Sam III M.D   On: 12/12/2016 08:07   Ct Abdomen Pelvis W Contrast  Result Date: 12/12/2016 CLINICAL DATA:  Right-sided abdominal pain EXAM: CT ABDOMEN AND PELVIS WITH CONTRAST TECHNIQUE: Multidetector CT imaging of the abdomen and pelvis was performed using the standard protocol following bolus administration of intravenous contrast. CONTRAST:  ISOVUE-300 IOPAMIDOL (ISOVUE-300) INJECTION 61% COMPARISON:  None. FINDINGS:  Lower chest: No acute abnormality. Hepatobiliary: No focal liver abnormality is seen. No gallstones, gallbladder wall thickening, or biliary dilatation. Pancreas: Unremarkable. No pancreatic ductal dilatation or surrounding inflammatory changes. Spleen:  Within normal limits. Adrenals/Urinary Tract: The adrenal glands are within normal limits. The left kidney is well visualize without evidence of renal calculi. Decreased perfusion of the right kidney is noted with multiple small nonobstructing renal stones measuring 1-3 mm. Fullness of the right renal collecting system is noted extending into the right ureter. At the right UVJ there is a the 2 mm obstructing stone identified. This is best visualized on image number 80 of series 7. The bladder is decompressed. Stomach/Bowel: The cecum is somewhat mobile extending towards the midline. The appendix is within normal limits. Vascular/Lymphatic: No significant vascular findings are present. No enlarged abdominal or pelvic lymph nodes. Reproductive: The uterus is within normal limits. Bilateral ovarian follicular changes are noted. Other: No abdominal wall hernia or abnormality. No abdominopelvic ascites. Musculoskeletal: No acute or significant osseous findings. IMPRESSION: Right UVJ stone measuring 2 mm with obstructive change in decreased perfusion of the right kidney. Multiple nonobstructing renal calculi are noted on the right as well. Electronically Signed   By: Alcide Clever M.D.   On: 12/12/2016 07:27   US Pelvic Doppler (torsion R/o Or Mass Arterial Flow)  Result Date: 12/12/2016 CLINICAL DATA:  Right lower quadrant pain. EXAM: TRANSABDOMINAL AND TRANSVAGINAL ULTRASOUND OF PELVIS DOPPLER ULTRASOUND OF OVARIES TECHNIQUE: Both transabdominal and transvaginal ultrasound examinations of the pelvis were performed. Transabdominal technique was performed for global imaging of the pelvis including uterus, ovaries, adnexal regions, and pelvic cul-de-sac. It was  necessary to proceed with endovaginal exam following the transabdominal exam to visualize the ovaries and endometrium. Color and duplex Doppler ultrasound was utilized to evaluate blood flow to the ovaries. COMPARISON:  None. FINDINGS: Uterus Measurements: 7.8 x 4.9 x 6.3 cm. No fibroids or other mass visualized. Endometrium Thickness: 12.3 mm.  No focal abnormality visualized. Right ovary Measurements: 2 x 2.7 x 1.8 cm. Normal appearance/no adnexal mass. Left ovary Measurements: 1.3 x 2.4 x 1.9 cm. Normal appearance/no adnexal mass. Pulsed Doppler evaluation of both ovaries demonstrates normal low-resistance arterial and venous waveforms. Other findings Trace fluid in the pelvis is likely physiologic. IMPRESSION: 1. No cause for right-sided pain identified on this study. The uterus, endometrium, and ovaries are normal in appearance with arterial and venous blood flow documented in both ovaries. Electronically Signed   By: Gerome Sam III M.D   On: 12/12/2016 08:07    Procedures Procedures (including critical care time)  Medications Ordered in ED Medications  cefTRIAXone (ROCEPHIN) 1 g in dextrose 5 % 50 mL IVPB (1 g Intravenous New Bag/Given 12/12/16  1610)  HYDROmorphone (DILAUDID) injection 0.5 mg (0.5 mg Intravenous Given 12/12/16 0628)  ondansetron (ZOFRAN) injection 4 mg (4 mg Intravenous Given 12/12/16 0627)  sodium chloride 0.9 % bolus 1,000 mL (1,000 mLs Intravenous New Bag/Given 12/12/16 9604)  phenazopyridine (PYRIDIUM) tablet 200 mg (200 mg Oral Given 12/12/16 0829)  iopamidol (ISOVUE-300) 61 % injection (100 mLs  Contrast Given 12/12/16 0700)  ketorolac (TORADOL) 30 MG/ML injection 15 mg (15 mg Intravenous Given 12/12/16 5409)     Initial Impression / Assessment and Plan / ED Course  I have reviewed the triage vital signs and the nursing notes.  Pertinent labs & imaging results that were available during my care of the patient were reviewed by me and considered in my medical decision  making (see chart for details).     Vitals:   12/11/16 2348 12/11/16 2350 12/12/16 0239 12/12/16 0500  BP: 129/89  127/85 (!) 137/94  Pulse: 90  96 94  Resp: 20  18   Temp: 98.4 F (36.9 C)     TempSrc: Oral     SpO2: 100%  100% 100%  Weight:  82.1 kg (181 lb)    Height:   (1.727 m)      Medications  cefTRIAXone (ROCEPHIN) 1 g in dextrose 5 % 50 mL IVPB (1 g Intravenous New Bag/Given 12/12/16 0923)  HYDROmorphone (DILAUDID) injection 0.5 mg (0.5 mg Intravenous Given 12/12/16 0628)  ondansetron (ZOFRAN) injection 4 mg (4 mg Intravenous Given 12/12/16 8119)  sodium chloride 0.9 % bolus 1,000 mL (1,000 mLs Intravenous New Bag/Given 12/12/16 1478)  phenazopyridine (PYRIDIUM) tablet 200 mg (200 mg Oral Given 12/12/16 0829)  iopamidol (ISOVUE-300) 61 % injection (100 mLs  Contrast Given 12/12/16 0700)  ketorolac (TORADOL) 30 MG/ML injection 15 mg (15 mg Intravenous Given 12/12/16 0923)    Donnesha JACARI KIRSTEN is 41 y.o. female presenting with severe acute right lower quadrant pain associated with urinary urgency and sensation of incomplete void and nausea which I believe is secondary to severe pain. Consider kidney stone, she's never had one prior and she has no blood in the urine, there is a significant amount of bacteria in the urine with no leukocytes or nitrites. Also no white blood cells. Patient advised to remain nothing by mouth, given Dilaudid and Pyridium. Will obtain ultrasound to rule out torsion, CT for stones/appendicitis and will perform pelvic exam. Blood work reassuring with no leukocytosis, elevation in creatinine etc.  Ultrasound with no evidence of torsion her other adnexal pathology.CT reveals a normal appendix in and 2 mm right UVJ stone. Mild hydronephrosis. Radiologist notes a decreased perfusion of the right kidney.   Urology consult from Dr. Annabell Howells appreciated: discussed the CAT scan read and he states that this is not atypical with a downstream obstruction to have  decreased dye perfusion to the kidney. We've also discussed the finding of bacteria on the clean catch urine with no leukocytes, nitrites, elevated white count or clinical signs of a UTI. He states it is reasonable to culture +/- treatment.  Evaluation does not show pathology that would require ongoing emergent intervention or inpatient treatment. Pt is hemodynamically stable and mentating appropriately. Discussed findings and plan with patient/guardian, who agrees with care plan. All questions answered. Return precautions discussed and outpatient follow up given.    Final Clinical Impressions(s) / ED Diagnoses   Final diagnoses:  RLQ abdominal pain  Kidney stone on right side    New Prescriptions New Prescriptions   CEPHALEXIN (KEFLEX) 500 MG CAPSULE  Take 1 capsule (500 mg total) by mouth 2 (two) times daily.   OXYCODONE-ACETAMINOPHEN (PERCOCET) 5-325 MG TABLET    Take 1 tablet by mouth every 4 (four) hours as needed.   PROMETHAZINE (PHENERGAN) 25 MG TABLET    Take 1 tablet (25 mg total) by mouth every 6 (six) hours as needed for nausea or vomiting.     Kaylyn Lim 12/12/16 1610    Loren Racer, MD 12/19/16 [phone]

## 2016-12-13 LAB — URINE CULTURE: Culture: 20000 — AB

## 2016-12-14 ENCOUNTER — Telehealth: Payer: Self-pay | Admitting: *Deleted

## 2016-12-14 NOTE — Telephone Encounter (Signed)
Post ED Visit - Positive Culture Follow-up  Culture report reviewed by antimicrobial stewardship pharmacist:   Enzo Bi, Pharm.D.  Celedonio Miyamoto, [address].D., BCPS AQ-ID  Garvin Fila, Pharm.D., BCPS  Georgina Pillion, [address].D., BCPS  [place], [place].D., BCPS, AAHIVP  Estella Husk, Pharm.D., BCPS, AAHIVP  Lysle Pearl, PharmD, BCPS  Casilda Carls, PharmD, BCPS  Pollyann Samples, PharmD, BCPS  Positive urine culture Treated with Cephalexin, organism sensitive to the same and no further patient follow-up is required at this time.  Virl Axe [facility] 12/14/2016, 9:58 AM

## 2017-01-15 ENCOUNTER — Encounter (HOSPITAL_COMMUNITY): Payer: Self-pay | Admitting: *Deleted

## 2017-01-15 ENCOUNTER — Inpatient Hospital Stay (HOSPITAL_COMMUNITY): Payer: Self-pay | Admitting: Anesthesiology

## 2017-01-15 ENCOUNTER — Encounter (HOSPITAL_COMMUNITY)
Admission: AD | Disposition: A | Discharge status: Home / Self Care | Payer: Self-pay | Source: Ambulatory Visit | Attending: Obstetrics and Gynecology

## 2017-01-15 ENCOUNTER — Inpatient Hospital Stay (HOSPITAL_COMMUNITY): Payer: Self-pay | Admitting: Certified Registered Nurse Anesthetist

## 2017-01-15 ENCOUNTER — Inpatient Hospital Stay (HOSPITAL_COMMUNITY): Payer: Self-pay

## 2017-01-15 ENCOUNTER — Ambulatory Visit (HOSPITAL_COMMUNITY)
Admission: AD | Admit: 2017-01-15 | Discharge: 2017-01-15 | Disposition: A | Discharge status: Home / Self Care | Payer: Self-pay | Source: Ambulatory Visit | Attending: Obstetrics and Gynecology | Admitting: Obstetrics and Gynecology

## 2017-01-15 DIAGNOSIS — R109 Unspecified abdominal pain: Secondary | ICD-10-CM

## 2017-01-15 DIAGNOSIS — O26891 Other specified pregnancy related conditions, first trimester: Secondary | ICD-10-CM

## 2017-01-15 DIAGNOSIS — O00102 Left tubal pregnancy without intrauterine pregnancy: Secondary | ICD-10-CM | POA: Insufficient documentation

## 2017-01-15 HISTORY — PX: LAPAROSCOPY: SHX197

## 2017-01-15 LAB — CBC
HEMATOCRIT: 39.4 % (ref 36.0–46.0)
Hemoglobin: 13.3 g/dL (ref 12.0–15.0)
MCH: 29.5 pg (ref 26.0–34.0)
MCHC: 33.8 g/dL (ref 30.0–36.0)
MCV: 87.4 fL (ref 78.0–100.0)
Platelets: 277 10*3/uL (ref 150–400)
RBC: 4.51 MIL/uL (ref 3.87–5.11)
RDW: 13.1 % (ref 11.5–15.5)
WBC: 7.3 10*3/uL (ref 4.0–10.5)

## 2017-01-15 LAB — COMPREHENSIVE METABOLIC PANEL
ALT: 18 U/L (ref 14–54)
AST: 17 U/L (ref 15–41)
Albumin: 3.8 g/dL (ref 3.5–5.0)
Alkaline Phosphatase: 42 U/L (ref 38–126)
Anion gap: 6 (ref 5–15)
BILIRUBIN TOTAL: 0.7 mg/dL (ref 0.3–1.2)
BUN: 8 mg/dL (ref 6–20)
CO2: 27 mmol/L (ref 22–32)
CREATININE: 0.67 mg/dL (ref 0.44–1.00)
Calcium: 9.2 mg/dL (ref 8.9–10.3)
Chloride: 103 mmol/L (ref 101–111)
GFR calc Af Amer: >60 mL/min (ref >60)
Glucose, Bld: 99 mg/dL (ref 65–99)
Potassium: 4 mmol/L (ref 3.5–5.1)
Sodium: 136 mmol/L (ref 135–145)
TOTAL PROTEIN: 7 g/dL (ref 6.5–8.1)

## 2017-01-15 LAB — TYPE AND SCREEN
ABO/RH(D): O POS
ANTIBODY SCREEN: NEGATIVE

## 2017-01-15 LAB — URINALYSIS, ROUTINE W REFLEX MICROSCOPIC
Bilirubin Urine: NEGATIVE
GLUCOSE, UA: NEGATIVE mg/dL
Hgb urine dipstick: NEGATIVE
KETONES UR: NEGATIVE mg/dL
LEUKOCYTES UA: NEGATIVE
Nitrite: NEGATIVE
PH: 7 (ref 5.0–8.0)
Protein, ur: NEGATIVE mg/dL
Specific Gravity, Urine: 1.008 (ref 1.005–1.030)

## 2017-01-15 LAB — POCT PREGNANCY, URINE: Preg Test, Ur: POSITIVE — AB

## 2017-01-15 LAB — HCG, QUANTITATIVE, PREGNANCY: hCG, Beta Chain, Quant, S: 984 m[IU]/mL — ABNORMAL HIGH (ref <5)

## 2017-01-15 LAB — ABO/RH: ABO/RH(D): O POS

## 2017-01-15 SURGERY — LAPAROSCOPY OPERATIVE
Anesthesia: General

## 2017-01-15 SURGERY — LAPAROSCOPY, WITH ECTOPIC PREGNANCY SURGICAL TREATMENT
Anesthesia: General

## 2017-01-15 MED ORDER — LACTATED RINGERS IV SOLN
INTRAVENOUS | Status: DC
  Administered 2017-01-15 (×3): via INTRAVENOUS

## 2017-01-15 MED ORDER — FAMOTIDINE IN NACL 20-0.9 MG/50ML-% IV SOLN
20.0000 mg | Freq: Once | INTRAVENOUS | Status: AC
  Administered 2017-01-15: 20 mg via INTRAVENOUS
  Filled 2017-01-15: qty 50

## 2017-01-15 MED ORDER — FENTANYL CITRATE (PF) 100 MCG/2ML IJ SOLN
INTRAMUSCULAR | Status: DC | PRN
  Administered 2017-01-15: 50 ug via INTRAVENOUS
  Administered 2017-01-15: 100 ug via INTRAVENOUS
  Administered 2017-01-15: 50 ug via INTRAVENOUS

## 2017-01-15 MED ORDER — LIDOCAINE HCL (CARDIAC) 20 MG/ML IV SOLN
INTRAVENOUS | Status: AC
  Filled 2017-01-15: qty 5

## 2017-01-15 MED ORDER — FENTANYL CITRATE (PF) 250 MCG/5ML IJ SOLN
INTRAMUSCULAR | Status: AC
  Filled 2017-01-15: qty 5

## 2017-01-15 MED ORDER — MIDAZOLAM HCL 2 MG/2ML IJ SOLN
INTRAMUSCULAR | Status: DC | PRN
  Administered 2017-01-15: 2 mg via INTRAVENOUS

## 2017-01-15 MED ORDER — LACTATED RINGERS IR SOLN: Status: DC | PRN

## 2017-01-15 MED ORDER — KETOROLAC TROMETHAMINE 30 MG/ML IJ SOLN
INTRAMUSCULAR | Status: DC | PRN
  Administered 2017-01-15: 30 mg via INTRAVENOUS

## 2017-01-15 MED ORDER — SODIUM CHLORIDE 0.9 % IR SOLN
Status: DC | PRN
  Administered 2017-01-15: 3000 mL

## 2017-01-15 MED ORDER — SOD CITRATE-CITRIC ACID 500-334 MG/5ML PO SOLN
30.0000 mL | Freq: Once | ORAL | Status: AC
  Administered 2017-01-15: 30 mL via ORAL
  Filled 2017-01-15: qty 15

## 2017-01-15 MED ORDER — SUGAMMADEX SODIUM 200 MG/2ML IV SOLN
INTRAVENOUS | Status: AC
  Filled 2017-01-15: qty 2

## 2017-01-15 MED ORDER — ROCURONIUM BROMIDE 100 MG/10ML IV SOLN
INTRAVENOUS | Status: DC | PRN
Start: 2017-01-15 — End: 2017-01-15
  Administered 2017-01-15: 50 mg via INTRAVENOUS

## 2017-01-15 MED ORDER — HYDROMORPHONE HCL 1 MG/ML IJ SOLN
INTRAMUSCULAR | Status: AC
  Filled 2017-01-15: qty 0.5

## 2017-01-15 MED ORDER — PROPOFOL 10 MG/ML IV BOLUS
INTRAVENOUS | Status: AC
  Filled 2017-01-15: qty 20

## 2017-01-15 MED ORDER — SUGAMMADEX SODIUM 200 MG/2ML IV SOLN
INTRAVENOUS | Status: DC | PRN
  Administered 2017-01-15: 160 mg via INTRAVENOUS

## 2017-01-15 MED ORDER — PROMETHAZINE HCL 25 MG/ML IJ SOLN: 6.2500 mg | INTRAMUSCULAR | Status: DC | PRN

## 2017-01-15 MED ORDER — SCOPOLAMINE 1 MG/3DAYS TD PT72
MEDICATED_PATCH | TRANSDERMAL | Status: DC | PRN
  Administered 2017-01-15: 1 via TRANSDERMAL

## 2017-01-15 MED ORDER — HYDROMORPHONE HCL 1 MG/ML IJ SOLN
0.2500 mg | INTRAMUSCULAR | Status: DC | PRN
  Administered 2017-01-15: 0.5 mg via INTRAVENOUS

## 2017-01-15 MED ORDER — ONDANSETRON HCL 4 MG/2ML IJ SOLN
INTRAMUSCULAR | Status: DC | PRN
  Administered 2017-01-15: 4 mg via INTRAVENOUS

## 2017-01-15 MED ORDER — ONDANSETRON HCL 4 MG/2ML IJ SOLN
INTRAMUSCULAR | Status: AC
  Filled 2017-01-15: qty 2

## 2017-01-15 MED ORDER — OXYCODONE HCL 5 MG PO TABS: 5.0000 mg | ORAL_TABLET | Freq: Once | ORAL | Status: DC | PRN

## 2017-01-15 MED ORDER — MIDAZOLAM HCL 2 MG/2ML IJ SOLN
INTRAMUSCULAR | Status: AC
  Filled 2017-01-15: qty 2

## 2017-01-15 MED ORDER — KETOROLAC TROMETHAMINE 30 MG/ML IJ SOLN
INTRAMUSCULAR | Status: AC
  Filled 2017-01-15: qty 1

## 2017-01-15 MED ORDER — DEXAMETHASONE SODIUM PHOSPHATE 10 MG/ML IJ SOLN
INTRAMUSCULAR | Status: DC | PRN
  Administered 2017-01-15: 8 mg via INTRAVENOUS

## 2017-01-15 MED ORDER — DEXAMETHASONE SODIUM PHOSPHATE 4 MG/ML IJ SOLN
INTRAMUSCULAR | Status: AC
  Filled 2017-01-15: qty 2

## 2017-01-15 MED ORDER — IBUPROFEN 800 MG PO TABS: 800.0000 mg | ORAL_TABLET | Freq: Three times a day (TID) | ORAL | 0 refills | Status: DC | PRN

## 2017-01-15 MED ORDER — LACTATED RINGERS IV BOLUS (SEPSIS)
1000.0000 mL | Freq: Once | INTRAVENOUS | Status: AC
  Administered 2017-01-15: 1000 mL via INTRAVENOUS

## 2017-01-15 MED ORDER — PROPOFOL 10 MG/ML IV BOLUS
INTRAVENOUS | Status: DC | PRN
  Administered 2017-01-15: 170 mg via INTRAVENOUS

## 2017-01-15 MED ORDER — BUPIVACAINE HCL (PF) 0.25 % IJ SOLN
INTRAMUSCULAR | Status: DC | PRN
  Administered 2017-01-15: 30 mL

## 2017-01-15 MED ORDER — MEPERIDINE HCL 25 MG/ML IJ SOLN
INTRAMUSCULAR | Status: AC
  Filled 2017-01-15: qty 1

## 2017-01-15 MED ORDER — SCOPOLAMINE 1 MG/3DAYS TD PT72
MEDICATED_PATCH | TRANSDERMAL | Status: AC
  Filled 2017-01-15: qty 1

## 2017-01-15 MED ORDER — ROCURONIUM BROMIDE 100 MG/10ML IV SOLN
INTRAVENOUS | Status: AC
  Filled 2017-01-15: qty 1

## 2017-01-15 MED ORDER — MEPERIDINE HCL 25 MG/ML IJ SOLN
6.2500 mg | INTRAMUSCULAR | Status: DC | PRN
  Administered 2017-01-15 (×2): 12.5 mg via INTRAVENOUS

## 2017-01-15 MED ORDER — LIDOCAINE HCL (CARDIAC) 20 MG/ML IV SOLN
INTRAVENOUS | Status: DC | PRN
  Administered 2017-01-15: 80 mg via INTRAVENOUS

## 2017-01-15 MED ORDER — OXYCODONE HCL 5 MG/5ML PO SOLN: 5.0000 mg | Freq: Once | ORAL | Status: DC | PRN

## 2017-01-15 MED ORDER — OXYCODONE HCL 5 MG PO TABS: 5.0000 mg | ORAL_TABLET | Freq: Four times a day (QID) | ORAL | 0 refills | Status: DC | PRN

## 2017-01-15 SURGICAL SUPPLY — 34 items
APL SRG 38 LTWT LNG FL B (MISCELLANEOUS) ×1
APPLICATOR ARISTA FLEXITIP XL (MISCELLANEOUS) ×2 IMPLANT
BAG SPEC RTRVL LRG 6X4 10 (ENDOMECHANICALS) ×1
CABLE HIGH FREQUENCY MONO STRZ (ELECTRODE) IMPLANT
DRSG OPSITE POSTOP 3X4 (GAUZE/BANDAGES/DRESSINGS) ×2 IMPLANT
GAUZE VASELINE 3X9 (GAUZE/BANDAGES/DRESSINGS) ×2 IMPLANT
GLOVE BIOGEL M 6.5 STRL (GLOVE) ×4 IMPLANT
GLOVE BIOGEL PI IND STRL 6.5 (GLOVE) ×1 IMPLANT
GLOVE BIOGEL PI IND STRL 7.0 (GLOVE) ×2 IMPLANT
GLOVE BIOGEL PI INDICATOR 6.5 (GLOVE) ×2
GLOVE BIOGEL PI INDICATOR 7.0 (GLOVE) ×6
GOWN STRL REUS W/TWL LRG LVL3 (GOWN DISPOSABLE) ×6 IMPLANT
HEMOSTAT ARISTA ABSORB 3G PWDR (MISCELLANEOUS) ×2 IMPLANT
NS IRRIG 1000ML POUR BTL (IV SOLUTION) ×3 IMPLANT
PACK LAPAROSCOPY BASIN (CUSTOM PROCEDURE TRAY) ×3 IMPLANT
PACK TRENDGUARD 450 HYBRID PRO (MISCELLANEOUS) IMPLANT
PACK TRENDGUARD 600 HYBRD PROC (MISCELLANEOUS) IMPLANT
POUCH SPECIMEN RETRIEVAL 10MM (ENDOMECHANICALS) ×2 IMPLANT
PROTECTOR NERVE ULNAR (MISCELLANEOUS) ×6 IMPLANT
SEALER TISSUE G2 CVD JAW 35 (ENDOMECHANICALS) IMPLANT
SEALER TISSUE G2 CVD JAW 45CM (ENDOMECHANICALS)
SET IRRIG TUBING LAPAROSCOPIC (IRRIGATION / IRRIGATOR) ×2 IMPLANT
SHEARS HARMONIC ACE PLUS 36CM (ENDOMECHANICALS) IMPLANT
SLEEVE XCEL OPT CAN 5 100 (ENDOMECHANICALS) ×3 IMPLANT
SOLUTION ELECTROLUBE (MISCELLANEOUS) IMPLANT
SUT VIC AB 4-0 PS2 27 (SUTURE) ×3 IMPLANT
SUT VICRYL 0 UR6 27IN ABS (SUTURE) IMPLANT
TOWEL OR 17X24 6PK STRL BLUE (TOWEL DISPOSABLE) ×6 IMPLANT
TRAY FOLEY CATH SILVER 16FR (SET/KITS/TRAYS/PACK) ×3 IMPLANT
TRENDGUARD 450 HYBRID PRO PACK (MISCELLANEOUS) ×3
TRENDGUARD 600 HYBRID PROC PK (MISCELLANEOUS)
TROCAR XCEL NON-BLD 11X100MML (ENDOMECHANICALS) ×2 IMPLANT
TROCAR XCEL NON-BLD 5MMX100MML (ENDOMECHANICALS) ×3 IMPLANT
WARMER LAPAROSCOPE (MISCELLANEOUS) ×3 IMPLANT

## 2017-01-15 NOTE — MAU Provider Note (Signed)
History     CSN: [id number]  Arrival date and time: 01/15/17 0709   None     Chief Complaint  Patient presents with  . Abdominal Pain   Alison Thornton 41 y.o. [id number] @ [redacted]w[redacted]d presents to MAU stating that she is approximately [redacted] weeks pregnant and started having left sided abdominal pain last night that is somewhat better this am but still hurting. She denies any bleeding. She is an established pt of [facility]. She had a tubal reversal in 06/2016 and this is her first pregnancy since the reversal.     Pertinent Gynecological History:  Bleeding: none Contraception: none DES exposure: unknown Blood transfusions: none Sexually transmitted diseases: no past history Previous GYN Procedures: tubal reversal    Past Medical History:  Diagnosis Date  . Breast mass 07/20/2016   Right breast lump x 2 weeks     Past Surgical History:  Procedure Laterality Date  . AUGMENTATION MAMMAPLASTY Bilateral 2016   Saline Implants   . BREAST EXCISIONAL BIOPSY Right    ? 1990's Benign   . BREAST SURGERY    . ECTOPIC PREGNANCY SURGERY      Family History  Problem Relation Age of Onset  . Diabetes Mother   . Hypertension Mother   . Breast cancer Mother   . Stroke Father   . Heart failure Maternal Grandmother   . Asthma Maternal Grandmother   . Cancer Maternal Grandfather   . Alzheimer's disease Maternal Grandfather   . Breast cancer Maternal Aunt     Social History  Substance Use Topics  . Smoking status: Never Smoker  . Smokeless tobacco: Never Used  . Alcohol use No    Allergies:  Allergies  Allergen Reactions  . Codeine Itching    Prescriptions Prior to Admission  Medication Sig Dispense Refill Last Dose  . cephALEXin (KEFLEX) 500 MG capsule Take 1 capsule (500 mg total) by mouth 2 (two) times daily. 20 capsule 0   . oxyCODONE-acetaminophen (PERCOCET) 5-325 MG tablet Take 1 tablet by mouth every 4 (four) hours as needed. 17 tablet 0   . promethazine (PHENERGAN) 25 MG  tablet Take 1 tablet (25 mg total) by mouth every 6 (six) hours as needed for nausea or vomiting. 12 tablet 0     Review of Systems  Genitourinary: Positive for pelvic pain.       Vaginal pressure  All other systems reviewed and are negative.  Physical Exam   Blood pressure 135/78, pulse (!) 116, temperature 98 F (36.7 C), temperature source Oral, height 5' 8.5" (1.74 m), weight 184 lb (83.5 kg), last menstrual period 12/13/2016.  Physical Exam  Nursing note and vitals reviewed. Constitutional: She is oriented to person, place, and time. She appears well-developed and well-nourished. No distress.  HENT:  Head: Normocephalic and atraumatic.  Neck: Normal range of motion.  Cardiovascular: Normal rate.   Respiratory: Effort normal and breath sounds normal. No respiratory distress.  GI: Soft. She exhibits no distension. There is tenderness.  Genitourinary: Vagina normal.  Genitourinary Comments: Moderate pain illicited upon pelvic exam in left adnexal region.  Musculoskeletal: Normal range of motion.  Neurological: She is alert and oriented to person, place, and time.  Skin: Skin is warm and dry.  Psychiatric: She has a normal mood and affect.   US Ob Comp Less 14 Wks  Result Date: 01/15/2017 : CLINICAL DATA: Abdominal pain and pressure. Quantitative beta HCG is 984 today. LMP was 12/13/2016 . Gestational age by LMP is 4  weeks 5 days . EDC by LMP is 09/19/2017 . History of ectopic pregnancy. EXAM: OBSTETRIC <14 WK US AND TRANSVAGINAL OB US TECHNIQUE: Both transabdominal and transvaginal ultrasound examinations were performed for complete evaluation of the gestation as well as the maternal uterus, adnexal regions, and pelvic cul-de-sac. Transvaginal technique was performed to assess early pregnancy. COMPARISON: 12/12/2016 FINDINGS: Intrauterine gestational sac: None Yolk sac: None Embryo: None Cardiac Activity: None Subchorionic hemorrhage: Not applicable Maternal uterus/adnexae: Within  the left adnexal region, there is a tubular structure measuring 2.0 x 1.8 x 1.9 cm. A small sac-like structure abuts the left ovary and measures 1.0 x 0.8 x 0.9 cm. The central cystic portion measures 3.5 mm ( [redacted] week gestational age). There is moderate mildly complex free pelvic fluid. A left corpus luteum is present and discrete from this mass. Right ovary is normal in appearance. IMPRESSION: 1.  No intrauterine pregnancy identified. 2.  Findings are suspicious for a left adnexal pregnancy. 3.  Free pelvic fluid suspicious for possible rupture. Critical Value/emergent results were called by telephone at the time of interpretation on 01/15/2017 at 9:50 am to [place] working with Franchot ErichsenLori Clemmens , who verbally acknowledged these results. Electronically Signed   By: Norva PavlovElizabeth  Brown M.D.   On: 01/15/2017 10:07   [place] Ob Transvaginal  Result Date: 01/15/2017 CLINICAL DATA:  Abdominal pain and pressure. Quantitative beta HCG is 984 today. LMP was09/24/2018. Gestational age by LMP is4 weeks 5 days. EDC by LMP is07/03/2017. History of ectopic pregnancy. EXAM: OBSTETRIC <14 WK US AND TRANSVAGINAL OB US TECHNIQUE: Both transabdominal and transvaginal ultrasound examinations were performed for complete evaluation of the gestation as well as the maternal uterus, adnexal regions, and pelvic cul-de-sac. Transvaginal technique was performed to assess early pregnancy. COMPARISON:  12/12/2016 FINDINGS: Intrauterine gestational sac: None Yolk sac:  None Embryo:  None Cardiac Activity: None Subchorionic hemorrhage:  Not applicable Maternal uterus/adnexae: Within the left adnexal region, there is a tubular structure measuring 2.0 x 1.8 x 1.9 cm. A small sac-like structure abuts the left ovary and measures 1.0 x 0.8 x 0.9 cm. The central cystic portion measures 3.5 mm ( [redacted] week gestational age). There is moderate mildly complex free pelvic fluid. A left corpus luteum is present and discrete from this mass. Right ovary is normal in  appearance. IMPRESSION: 1. No intrauterine pregnancy identified. 2. Findings are suspicious for a left adnexal pregnancy. 3. Free pelvic fluid suspicious for possible rupture. Critical Value/emergent results were called by telephone at the time of interpretation on 01/15/2017 at 9:50 am to [place] working with Franchot ErichsenLori Clemmens, who verbally acknowledged these results. Electronically Signed   By: Norva PavlovElizabeth  Brown M.D.   On: 01/15/2017 09:51    Results for orders placed or performed during the hospital encounter of 01/15/17 (from the past 24 hour(s))  Urinalysis, Routine w reflex microscopic     Status: None   Collection Time: 01/15/17  7:13 AM  Result Value Ref Range   Color, Urine YELLOW YELLOW   APPearance CLEAR CLEAR   Specific Gravity, Urine 1.008 1.005 - 1.030   pH 7.0 5.0 - 8.0   Glucose, UA NEGATIVE NEGATIVE mg/dL   Hgb urine dipstick NEGATIVE NEGATIVE   Bilirubin Urine NEGATIVE NEGATIVE   Ketones, ur NEGATIVE NEGATIVE mg/dL   Protein, ur NEGATIVE NEGATIVE mg/dL   Nitrite NEGATIVE NEGATIVE   Leukocytes, UA NEGATIVE NEGATIVE  Pregnancy, urine POC     Status: Abnormal   Collection Time: 01/15/17  7:26 AM  Result Value  Ref Range   Preg Test, Ur POSITIVE (A) NEGATIVE  hCG, quantitative, pregnancy     Status: Abnormal   Collection Time: 01/15/17  8:40 AM  Result Value Ref Range   hCG, Beta Chain, Quant, S 984 (H) <5 mIU/mL    MAU Course  Procedures  MDM After ultrasound and lab reports reviewed, Dr Richardson Dopp consulted for possible ectopic pregnancy. Dr Richardson Dopp to evaluate pt.  Assessment and Plan  Ectopic pregnancy Left tube  Prepare pt for surgery  Lawson Fiscal A Clemmons CNM 01/15/2017, 10:26 AM

## 2017-01-15 NOTE — Anesthesia Preprocedure Evaluation (Deleted)
Anesthesia Evaluation  Patient identified by MRN, date of birth, ID band Patient awake    Reviewed: Allergy & Precautions, NPO status , Patient's Chart, lab work & pertinent test results  Airway Mallampati: II  TM Distance: >3 FB Neck ROM: Full    Dental no notable dental hx.    Pulmonary neg pulmonary ROS,    Pulmonary exam normal breath sounds clear to auscultation       Cardiovascular negative cardio ROS Normal cardiovascular exam Rhythm:Regular Rate:Normal     Neuro/Psych negative neurological ROS  negative psych ROS   GI/Hepatic negative GI ROS, Neg liver ROS,   Endo/Other  negative endocrine ROS  Renal/GU negative Renal ROS     Musculoskeletal negative musculoskeletal ROS (+)   Abdominal   Peds  Hematology negative hematology ROS (+)   Anesthesia Other Findings   Reproductive/Obstetrics negative OB ROS Ectopic                             Anesthesia Physical Anesthesia Plan  ASA: II and emergent  Anesthesia Plan: General   Post-op Pain Management:    Induction: Intravenous, Cricoid pressure planned and Rapid sequence  PONV Risk Score and Plan: 4 or greater and Ondansetron, Dexamethasone, Midazolam, Scopolamine patch - Pre-op and Treatment may vary due to age or medical condition  Airway Management Planned: Oral ETT  Additional Equipment:   Intra-op Plan:   Post-operative Plan: Extubation in OR  Informed Consent: I have reviewed the patients History and Physical, chart, labs and discussed the procedure including the risks, benefits and alternatives for the proposed anesthesia with the patient or authorized representative who has indicated his/her understanding and acceptance.   Dental advisory given  Plan Discussed with: CRNA  Anesthesia Plan Comments:         Anesthesia Quick Evaluation

## 2017-01-15 NOTE — Transfer of Care (Signed)
Immediate Anesthesia Transfer of Care Note  Patient: Alison Thornton  Procedure(s) Performed: DIAGNOSTIC LAPAROSCOPY WITH REMOVAL OF ECTOPIC PREGNANCY (N/A )  Patient Location: PACU  Anesthesia Type:General  Level of Consciousness: awake, alert , oriented and patient cooperative  Airway & Oxygen Therapy: Patient Spontanous Breathing and Patient connected to nasal cannula oxygen  Post-op Assessment: Report given to RN, Post -op Vital signs reviewed and stable, Patient moving all extremities X 4 and Patient able to stick tongue midline  Post vital signs: Reviewed and stable  Last Vitals:  Vitals:   01/15/17 0732  BP: 135/78  Pulse: (!) 116  Temp: 36.7 C    Last Pain:  Vitals:   01/15/17 0733  TempSrc:   PainSc: 6          Complications: No apparent anesthesia complications

## 2017-01-15 NOTE — Anesthesia Preprocedure Evaluation (Signed)
Anesthesia Evaluation  Patient identified by MRN, date of birth, ID band Patient awake    Reviewed: Allergy & Precautions, NPO status , Patient's Chart, lab work & pertinent test results  Airway Mallampati: II  TM Distance: >3 FB Neck ROM: Full    Dental no notable dental hx.    Pulmonary neg pulmonary ROS,    Pulmonary exam normal breath sounds clear to auscultation       Cardiovascular negative cardio ROS Normal cardiovascular exam Rhythm:Regular Rate:Normal     Neuro/Psych negative neurological ROS  negative psych ROS   GI/Hepatic negative GI ROS, Neg liver ROS,   Endo/Other  negative endocrine ROS  Renal/GU negative Renal ROS     Musculoskeletal negative musculoskeletal ROS (+)   Abdominal   Peds  Hematology negative hematology ROS (+)   Anesthesia Other Findings   Reproductive/Obstetrics negative OB ROS Ectopic                             Anesthesia Physical Anesthesia Plan  ASA: II and emergent  Anesthesia Plan: General   Post-op Pain Management:    Induction: Intravenous, Cricoid pressure planned and Rapid sequence  PONV Risk Score and Plan: 4 or greater and Ondansetron, Dexamethasone, Midazolam, Scopolamine patch - Pre-op and Treatment may vary due to age or medical condition  Airway Management Planned: Oral ETT  Additional Equipment:   Intra-op Plan:   Post-operative Plan: Extubation in OR  Informed Consent: I have reviewed the patients History and Physical, chart, labs and discussed the procedure including the risks, benefits and alternatives for the proposed anesthesia with the patient or authorized representative who has indicated his/her understanding and acceptance.   Dental advisory given  Plan Discussed with: CRNA  Anesthesia Plan Comments:         Anesthesia Quick Evaluation  

## 2017-01-15 NOTE — Op Note (Signed)
01/15/2017  2:01 PM  PATIENT:  Alison Thornton  41 y.o. female  PRE-OPERATIVE DIAGNOSIS:  ruptured ectopic pregnancy  POST-OPERATIVE DIAGNOSIS:  * No post-op diagnosis entered *  PROCEDURE:  Laparoscopic Left salpingectomy with removal of Ectopic pregnancy . Lysis of adhesions.   SURGEON:  Surgeon(s) and Role:    * Gerald Leitzole, Aliana Kreischer, MD - Primary  PHYSICIAN ASSISTANT: None  ASSISTANTS:  Illene BolusLori Clemmons CNM  ANESTHESIA:   general  EBL:  100 mL   BLOOD ADMINISTERED:none  DRAINS: none   LOCAL MEDICATIONS USED:  MARCAINE     SPECIMEN:  Source of Specimen:  Left fallopian tube and suspected ectobic pregnancy   DISPOSITION OF SPECIMEN:  PATHOLOGY  COUNTS:  YES  TOURNIQUET:  * No tourniquets in log *  DICTATION: .Dragon Dictation  PLAN OF CARE: Discharge to home after PACU  PATIENT DISPOSITION:  PACU - hemodynamically stable.   Delay start of Pharmacological VTE agent (>24hrs) due to surgical blood loss or risk of bleeding: not applicable  Findings Dilated Left fallopian tube with rupture of ectopic at fimbriated end.. Adhesions of the right fallopian tube to the right pelvic side wall. Normal Ovaries bilaterally   Procedure: Procedure: the patient was taken to the operating room placed under general anesthesia. Time Out was performed.  She was  Prepped and draped in the normal sterile fashion. A foley catheter was placed. A uterine manipulator was placed. Attention was turned to the abdomen where the umbilicus was injected with 10 cc of marcaine. A 10 mm trocar was placed under direct visualization. Pneumoperitoneum was achieved with C02 gas... A 5 mm trocar was placed in the right and left lower quadrants. Each trocar site was injected with 10 cc of marcaine prior to trocar placement. The harmonic scalpel was used to excise  The left fallopian tube was excised along the mesosalpinx to the cornu with the harmonic scalpel. The Right fallopian tube adhesion was released with the  Harmonic scalpel.  An endo catch bag was placed through the 10 mm umbilical port. The specimen was placed in the bag and removed through the umbilical incision. Pneumoperitoneum was reestablished. Pt was noted to have bleeding from the left ovary this was controlled with the Kleppinger.  The pelvis was irrigated. Arista was placed along the left adnexa for additional hemostasis. Excellent hemostasis was noted. All trocars were removed under direct visualization . The pneumoperitoneum was released.  The fascia of the umbilical incision was re approximated with 0 vicryl. The skin incisions were closed with 4-0 vicryl and derma bond.  the patient was taken to the recovery room awake and in stable condition.  Sponge lap and needle counts were correct times 2.

## 2017-01-15 NOTE — Anesthesia Postprocedure Evaluation (Signed)
Anesthesia Post Note  Patient: Alison Thornton  Procedure(s) Performed: DIAGNOSTIC LAPAROSCOPY WITH REMOVAL OF ECTOPIC PREGNANCY (N/A )     Patient location during evaluation: PACU Anesthesia Type: General Level of consciousness: sedated and patient cooperative Pain management: pain level controlled Vital Signs Assessment: post-procedure vital signs reviewed and stable Respiratory status: spontaneous breathing Cardiovascular status: stable Anesthetic complications: no    Last Vitals:  Vitals:   01/15/17 1500 01/15/17 1600  BP: 110/69 112/60  Pulse: 85 87  Resp: 14 16  Temp: 36.7 C 37.3 C  SpO2: 98% 100%    Last Pain:  Vitals:   01/15/17 1600  TempSrc:   PainSc: 4    Pain Goal: Patients Stated Pain Goal: 4 (01/15/17 1600)               Lewie LoronJohn Branndon Tuite

## 2017-01-15 NOTE — Anesthesia Procedure Notes (Signed)
Procedure Name: Intubation Performed by: Okey RegalFLOWERS JR, Jensyn Shave W Pre-anesthesia Checklist: Patient identified, Suction available, Emergency Drugs available, Patient being monitored and Timeout performed Patient Re-evaluated:Patient Re-evaluated prior to induction Oxygen Delivery Method: Circle system utilized Preoxygenation: Pre-oxygenation with 100% oxygen Induction Type: IV induction Ventilation: Mask ventilation without difficulty Laryngoscope Size: Miller and 2 Grade View: Grade II Tube type: Oral Tube size: 7.0 mm Number of attempts: 1 Airway Equipment and Method: Stylet Placement Confirmation: ETT inserted through vocal cords under direct vision,  positive ETCO2 and breath sounds checked- equal and bilateral Secured at: 20 cm Tube secured with: Tape Dental Injury: Teeth and Oropharynx as per pre-operative assessment

## 2017-01-15 NOTE — H&P (Signed)
CSN: [id number]  Arrival date and time: 01/15/17 0709   None     Chief Complaint  Patient presents with  . Abdominal Pain   Alison Erma PintoJ Thornton 41 y.o. [id number] @ [id number] presents to MAU stating that she is approximately [redacted] weeks pregnant and started having left sided abdominal pain last night that is somewhat better this am but still hurting. She denies any bleeding. She is an established pt of [facility]. She had a tubal reversal in 06/2016 and this is her first pregnancy since the reversal.     Pertinent Gynecological History:  Bleeding: none Contraception: none DES exposure: unknown Blood transfusions: none Sexually transmitted diseases: no past history Previous GYN Procedures: tubal reversal    Past Medical History:  Diagnosis Date  . Breast mass 07/20/2016   Right breast lump x 2 weeks     Past Surgical History:  Procedure Laterality Date  . AUGMENTATION MAMMAPLASTY Bilateral 2016   Saline Implants   . BREAST EXCISIONAL BIOPSY Right    ? 1990's Benign   . BREAST SURGERY    . ECTOPIC PREGNANCY SURGERY      Family History  Problem Relation Age of Onset  . Diabetes Mother   . Hypertension Mother   . Breast cancer Mother   . Stroke Father   . Heart failure Maternal Grandmother   . Asthma Maternal Grandmother   . Cancer Maternal Grandfather   . Alzheimer's disease Maternal Grandfather   . Breast cancer Maternal Aunt     Social History  Substance Use Topics  . Smoking status: Never Smoker  . Smokeless tobacco: Never Used  . Alcohol use No    Allergies:  Allergies  Allergen Reactions  . Codeine Itching    Prescriptions Prior to Admission  Medication Sig Dispense Refill Last Dose  . cephALEXin (KEFLEX) 500 MG capsule Take 1 capsule (500 mg total) by mouth 2 (two) times daily. 20 capsule 0   . oxyCODONE-acetaminophen (PERCOCET) 5-325 MG tablet Take 1 tablet by mouth every 4 (four) hours as needed. 17 tablet 0   . promethazine (PHENERGAN) 25 MG tablet Take 1 tablet  (25 mg total) by mouth every 6 (six) hours as needed for nausea or vomiting. 12 tablet 0     Review of Systems  Genitourinary: Positive for pelvic pain.       Vaginal pressure  All other systems reviewed and are negative.  Physical Exam   Blood pressure 135/78, pulse (!) 116, temperature 98 F (36.7 C), temperature source Oral, height 5' 8.5" (1.74 m), weight 184 lb (83.5 kg), last menstrual period 12/13/2016.  Physical Exam  Nursing note and vitals reviewed. Constitutional: She is oriented to person, place, and time. She appears well-developed and well-nourished. No distress.  HENT:  Head: Normocephalic and atraumatic.  Neck: Normal range of motion.  Cardiovascular: Normal rate.   Respiratory: Effort normal and breath sounds normal. No respiratory distress.  GI: Soft. She exhibits no distension. There is tenderness.  Genitourinary: Vagina normal.  Genitourinary Comments: Moderate pain illicited upon pelvic exam in left adnexal region.  Musculoskeletal: Normal range of motion.  Neurological: She is alert and oriented to person, place, and time.  Skin: Skin is warm and dry.  Psychiatric: She has a normal mood and affect.   [place] Ob Comp Less 14 Wks  Result Date: 01/15/2017 : CLINICAL DATA: Abdominal pain and pressure. Quantitative beta HCG is 984 today. LMP was 12/13/2016 . Gestational age by LMP is 4 weeks 5 days . EDC  by LMP is 09/19/2017 . History of ectopic pregnancy. EXAM: OBSTETRIC <14 WK [place] AND TRANSVAGINAL OB US TECHNIQUE: Both transabdominal and transvaginal ultrasound examinations were performed for complete evaluation of the gestation as well as the maternal uterus, adnexal regions, and pelvic cul-de-sac. Transvaginal technique was performed to assess early pregnancy. COMPARISON: 12/12/2016 FINDINGS: Intrauterine gestational sac: None Yolk sac: None Embryo: None Cardiac Activity: None Subchorionic hemorrhage: Not applicable Maternal uterus/adnexae: Within the left adnexal  region, there is a tubular structure measuring 2.0 x 1.8 x 1.9 cm. A small sac-like structure abuts the left ovary and measures 1.0 x 0.8 x 0.9 cm. The central cystic portion measures 3.5 mm ( [redacted] week gestational age). There is moderate mildly complex free pelvic fluid. A left corpus luteum is present and discrete from this mass. Right ovary is normal in appearance. IMPRESSION: 1.  No intrauterine pregnancy identified. 2.  Findings are suspicious for a left adnexal pregnancy. 3.  Free pelvic fluid suspicious for possible rupture. Critical Value/emergent results were called by telephone at the time of interpretation on 01/15/2017 at 9:50 am to [place] working with Franchot Erichsen , who verbally acknowledged these results. Electronically Signed   By: Norva Pavlov M.D.   On: 01/15/2017 10:07   US Ob Transvaginal  Result Date: 01/15/2017 CLINICAL DATA:  Abdominal pain and pressure. Quantitative beta HCG is 984 today. LMP was09/24/2018. Gestational age by LMP is4 weeks 5 days. EDC by LMP is07/03/2017. History of ectopic pregnancy. EXAM: OBSTETRIC <14 WK [place] AND TRANSVAGINAL OB US TECHNIQUE: Both transabdominal and transvaginal ultrasound examinations were performed for complete evaluation of the gestation as well as the maternal uterus, adnexal regions, and pelvic cul-de-sac. Transvaginal technique was performed to assess early pregnancy. COMPARISON:  12/12/2016 FINDINGS: Intrauterine gestational sac: None Yolk sac:  None Embryo:  None Cardiac Activity: None Subchorionic hemorrhage:  Not applicable Maternal uterus/adnexae: Within the left adnexal region, there is a tubular structure measuring 2.0 x 1.8 x 1.9 cm. A small sac-like structure abuts the left ovary and measures 1.0 x 0.8 x 0.9 cm. The central cystic portion measures 3.5 mm ( [redacted] week gestational age). There is moderate mildly complex free pelvic fluid. A left corpus luteum is present and discrete from this mass. Right ovary is normal in appearance.  IMPRESSION: 1. No intrauterine pregnancy identified. 2. Findings are suspicious for a left adnexal pregnancy. 3. Free pelvic fluid suspicious for possible rupture. Critical Value/emergent results were called by telephone at the time of interpretation on 01/15/2017 at 9:50 am to [place] working with Franchot Erichsen, who verbally acknowledged these results. Electronically Signed   By: Norva Pavlov M.D.   On: 01/15/2017 09:51    Results for orders placed or performed during the hospital encounter of 01/15/17 (from the past 24 hour(s))  Urinalysis, Routine w reflex microscopic     Status: None   Collection Time: 01/15/17  7:13 AM  Result Value Ref Range   Color, Urine YELLOW YELLOW   APPearance CLEAR CLEAR   Specific Gravity, Urine 1.008 1.005 - 1.030   pH 7.0 5.0 - 8.0   Glucose, UA NEGATIVE NEGATIVE mg/dL   Hgb urine dipstick NEGATIVE NEGATIVE   Bilirubin Urine NEGATIVE NEGATIVE   Ketones, ur NEGATIVE NEGATIVE mg/dL   Protein, ur NEGATIVE NEGATIVE mg/dL   Nitrite NEGATIVE NEGATIVE   Leukocytes, UA NEGATIVE NEGATIVE  Pregnancy, urine POC     Status: Abnormal   Collection Time: 01/15/17  7:26 AM  Result Value Ref Range   Preg  Test, Ur POSITIVE (A) NEGATIVE  hCG, quantitative, pregnancy     Status: Abnormal   Collection Time: 01/15/17  8:40 AM  Result Value Ref Range   hCG, Beta Chain, Quant, S 984 (H) <5 mIU/mL    MAU Course  Procedures  MDM After ultrasound and lab reports reviewed, Dr Richardson Dopp consulted for possible ectopic pregnancy. Dr Richardson Dopp to evaluate pt.  Assessment and Plan  Ectopic pregnancy Left tube  Prepare pt for surgery  Lawson Fiscal A Clemmons CNM 01/15/2017, 10:26 AM   Asessment and Plan   Patient seen and examined... Suspected Ruptured Left ectopic pregnancy. Patient has had an ectopic pregnancy in the left tube iin the past. I discussed with her removal of ectopic and trying to salvage the left tube vs left salpingectomy. She desires left salpingectomy given that this is  recurrent. R/B/A of laparascopic left salpingectomy including but not limited to infection, bleeding, damage to bowel and surrounding organs with the need for further surgery discussed.Possibility of laparotomy discussed. Pt voiced understanding and desires to proceed.

## 2017-01-15 NOTE — Discharge Instructions (Addendum)
You have a scopolamine patch behind your left ear to prevent nausea. Please remove your patch in the morning. 01/16/2017

## 2017-01-15 NOTE — MAU Note (Signed)
Patient states that she has been having lower abdominal pain and pressure since Friday, October 26. Patient reports taking Tylenol 1000 mg last night and Tylenol 250 mg at 0400 today. Patient denies any vaginal bleeding or discharge. Patient reports a foul odor discharge several days ago, not present today.

## 2017-01-18 ENCOUNTER — Encounter (HOSPITAL_COMMUNITY): Payer: Self-pay | Admitting: Obstetrics and Gynecology

## 2017-01-19 NOTE — Addendum Note (Signed)
Addendum  created 01/19/17 1630 by Algis GreenhouseBurger, Jashawn Floyd A, CRNA   Charge Capture section accepted

## 2017-01-20 ENCOUNTER — Inpatient Hospital Stay
Admission: RE | Admit: 2017-01-20 | Discharge: 2017-01-20 | Disposition: A | Discharge status: Home / Self Care | Payer: No Typology Code available for payment source | Source: Ambulatory Visit | Attending: Obstetrics & Gynecology | Admitting: Obstetrics & Gynecology

## 2017-01-20 ENCOUNTER — Encounter (HOSPITAL_COMMUNITY): Payer: Self-pay | Admitting: Obstetrics and Gynecology

## 2017-12-12 ENCOUNTER — Encounter (HOSPITAL_COMMUNITY): Payer: Self-pay

## 2018-11-10 IMAGING — US US TRANSVAGINAL NON-OB
1 series · 13 of 25 positions shown · non-contrast
Comparison: None.

CLINICAL DATA: Right lower quadrant pain.



[Series 1: us transvaginal non-ob · 0.24mm/px · 13 of 52 slices shown]
[im 1/52]
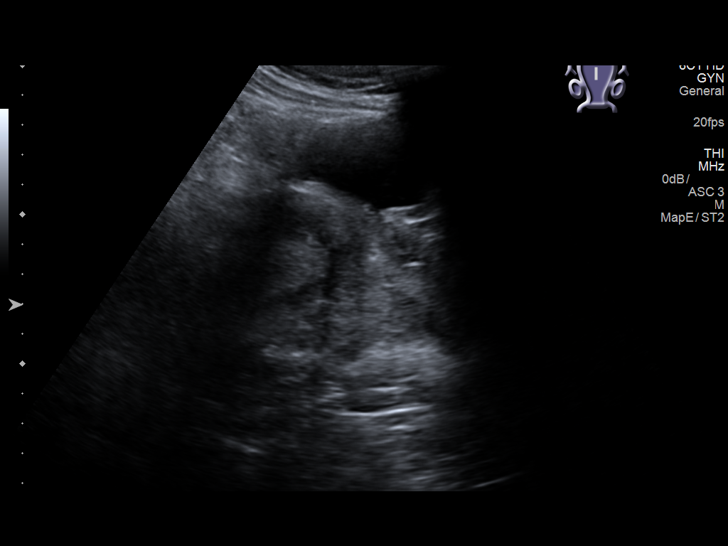
[im 5/52]
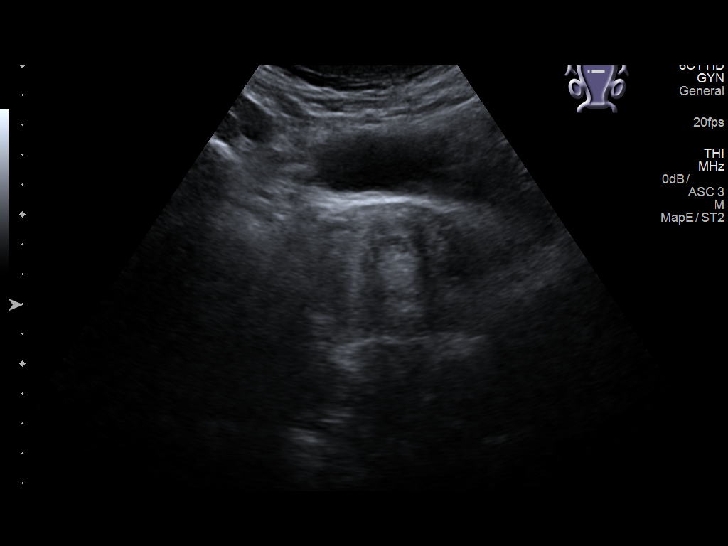
[im 9/52]
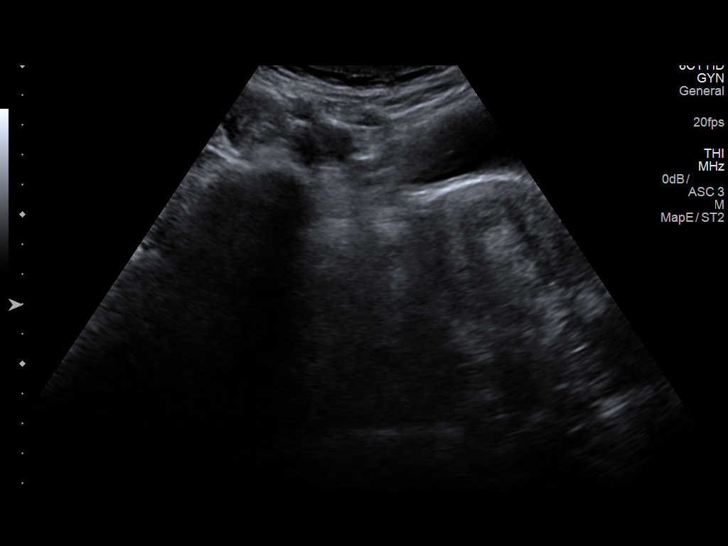
[im 13/52]
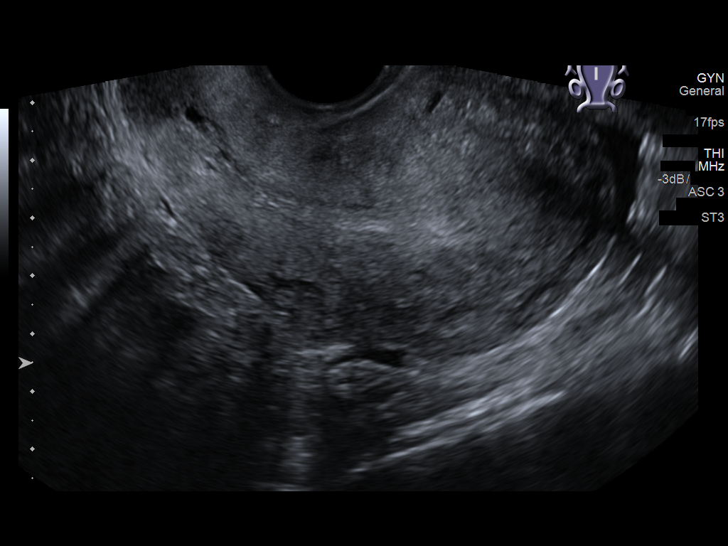
[im 18/52]
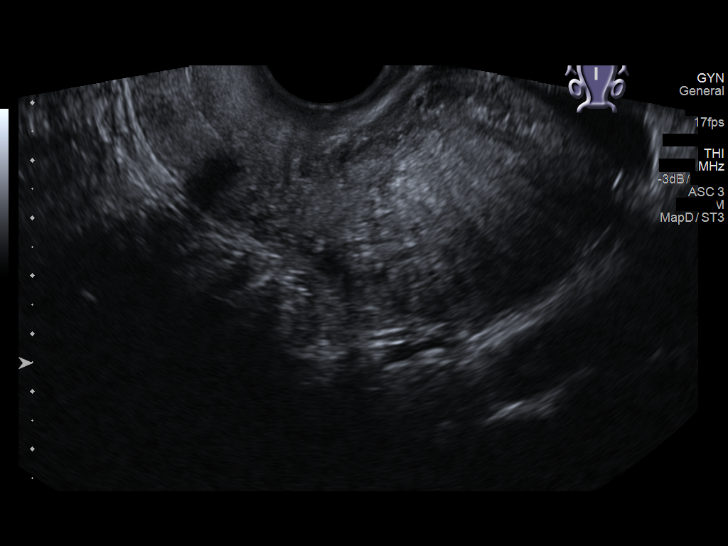
[im 22/52]
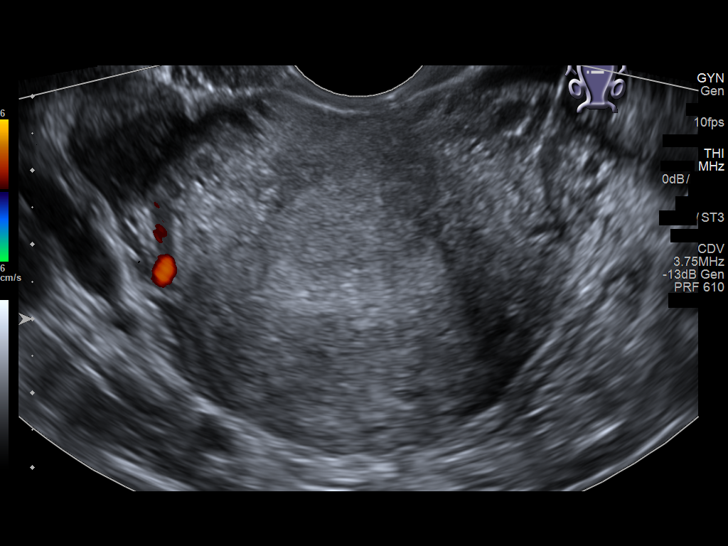
[im 26/52]
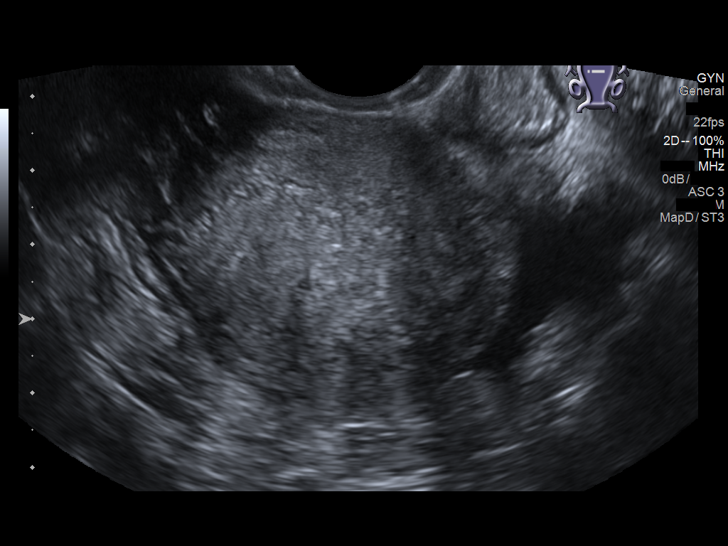
[im 30/52]
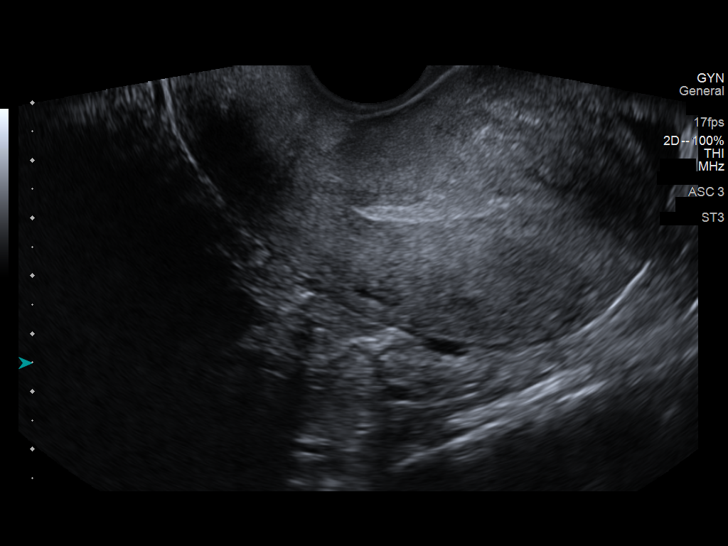
[im 35/52]
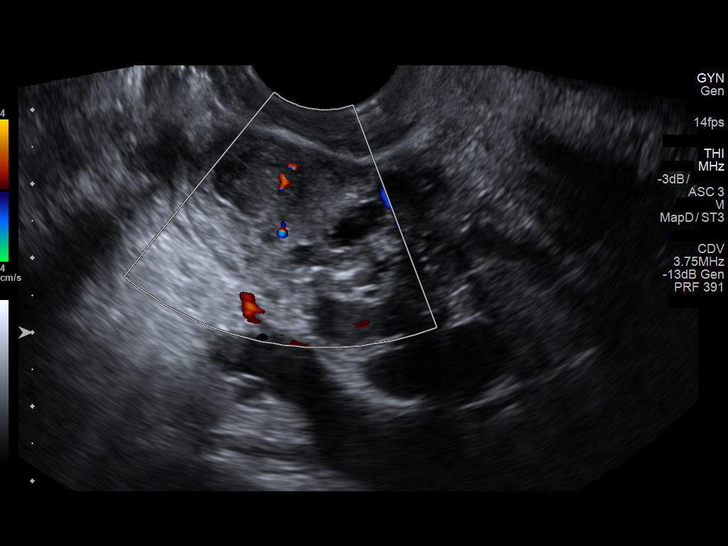
[im 39/52]
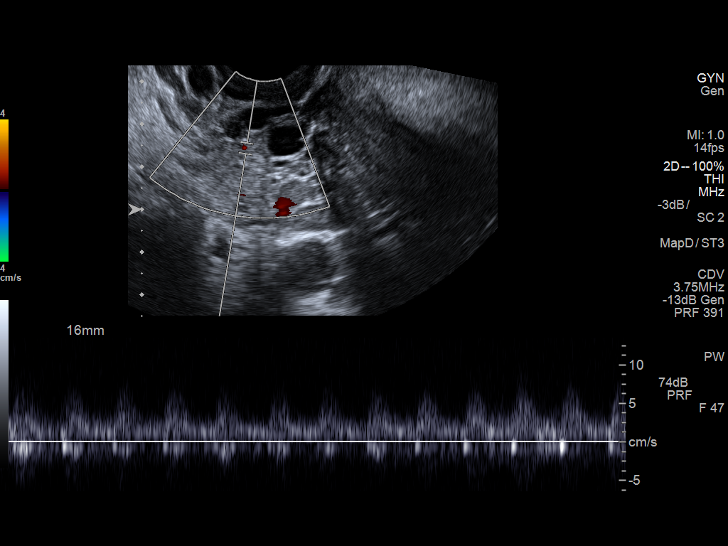
[im 43/52]
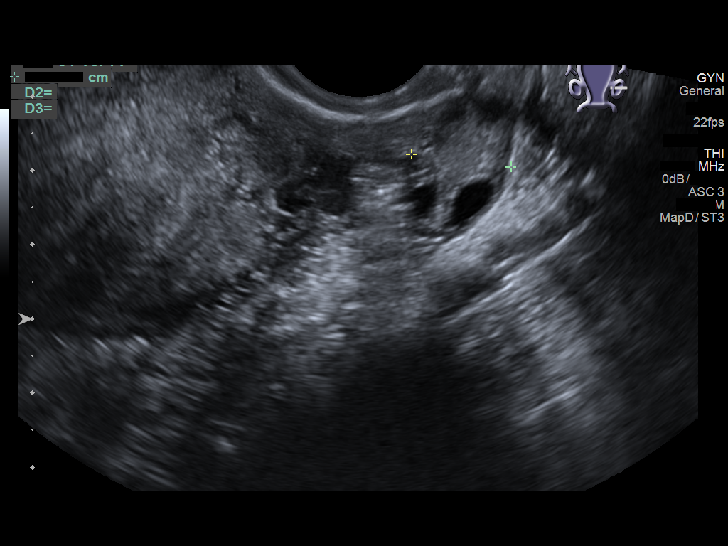
[im 47/52]
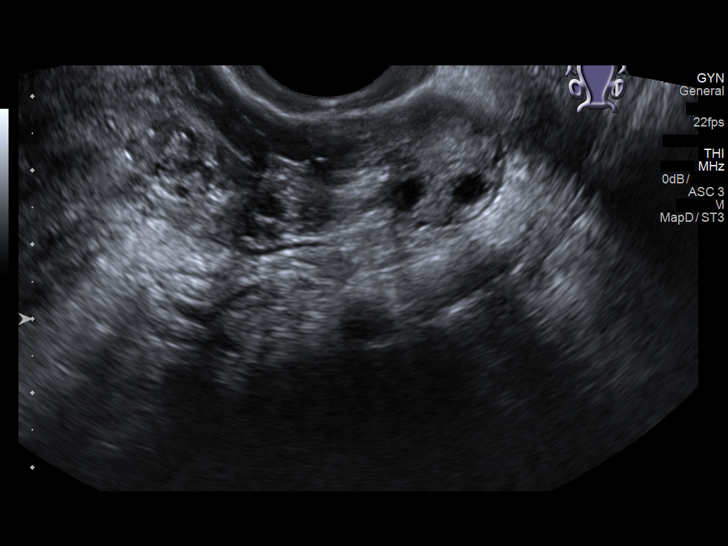
[im 52/52]
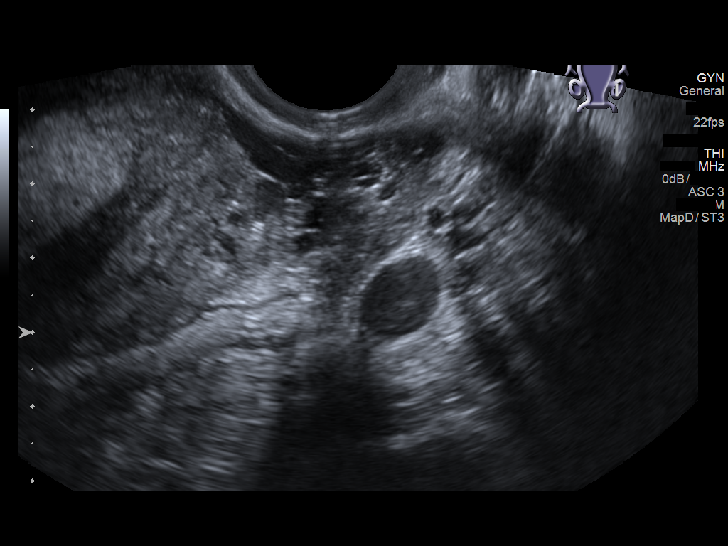

[13 of 25 positions shown; findings below may reference images not displayed]

FINDINGS: Uterus

Measurements: 7.8 x 4.9 x 6.3 cm. No fibroids or other mass
visualized.

Endometrium

Thickness: 12.3 mm.  No focal abnormality visualized.

Right ovary

Measurements: 2 x 2.7 x 1.8 cm. Normal appearance/no adnexal mass.

Left ovary

Measurements: 1.3 x 2.4 x 1.9 cm. Normal appearance/no adnexal mass.

Pulsed Doppler evaluation of both ovaries demonstrates normal
low-resistance arterial and venous waveforms.

Other findings

Trace fluid in the pelvis is likely physiologic.
IMPRESSION: 1. No cause for right-sided pain identified on this study. The
uterus, endometrium, and ovaries are normal in appearance with
arterial and venous blood flow documented in both ovaries.

## 2019-07-13 IMAGING — CT CT ABD-PELV W/ CM
2 of 7 series · 16 of 46 positions shown, 18 images · IV contrast (Omni 300)
Comparison: None.

CLINICAL DATA: Right-sided abdominal pain

EXAM:
CT ABDOMEN AND PELVIS WITH CONTRAST
TECHNIQUE: Multidetector CT imaging of the abdomen and pelvis was performed
using the standard protocol following bolus administration of
intravenous contrast.
CONTRAST:  100mL BIJCGA-966 IOPAMIDOL ([id number]-966) INJECTION 61%

[Series 5: a/p w/ cor · coronal · 0.79mm/px · 3 of 123 slices shown]
[im 41/123  soft-tissue]
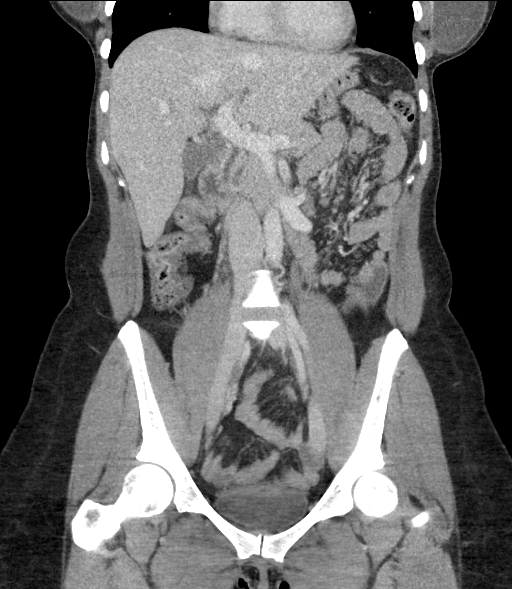
[im 62/123  soft-tissue]
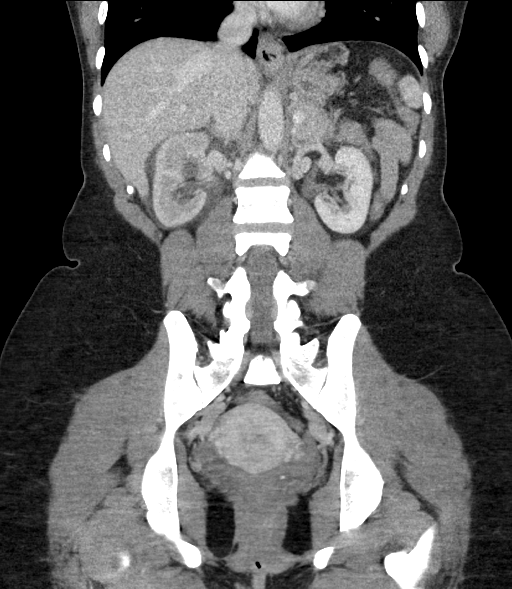
[im 82/123  soft-tissue]
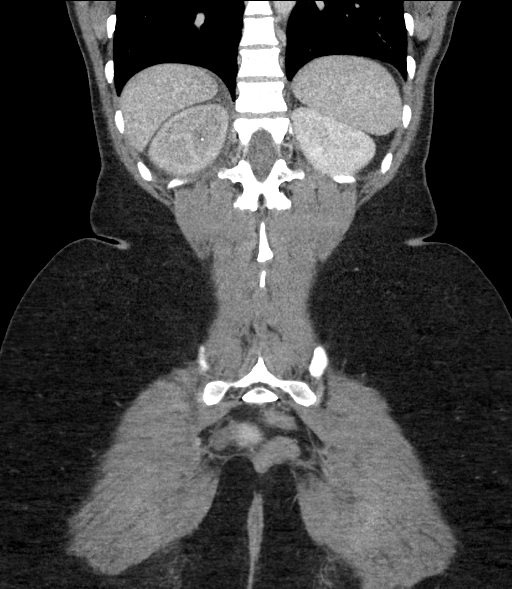

[Series 7: a/p w/ 5mm · axial · 0.65mm/px · z∈[+826,+1242]mm · 13 of 93 slices shown, 15 images]
[im 5/93  soft-tissue]
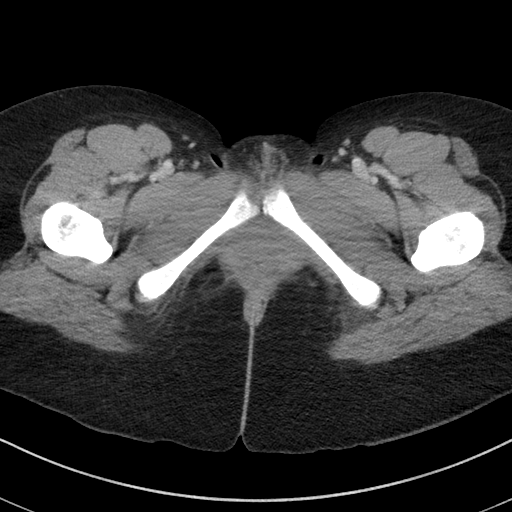
[im 5/93  bone]
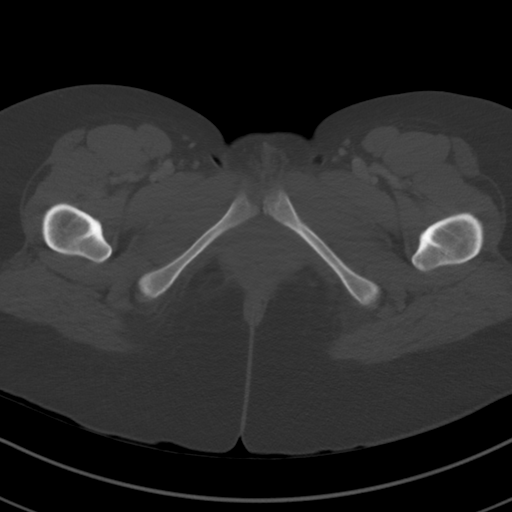
[im 14/93  soft-tissue]
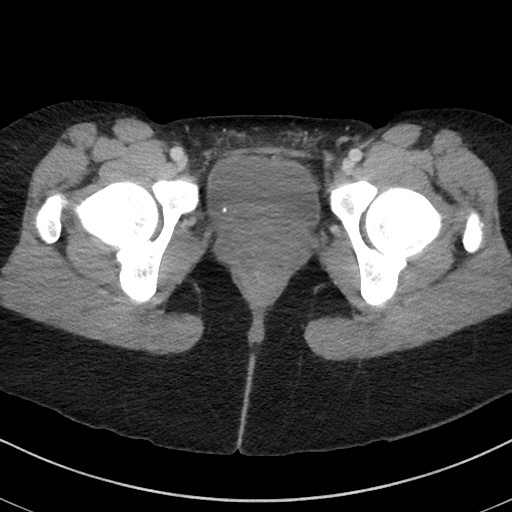
[im 19/93  soft-tissue]
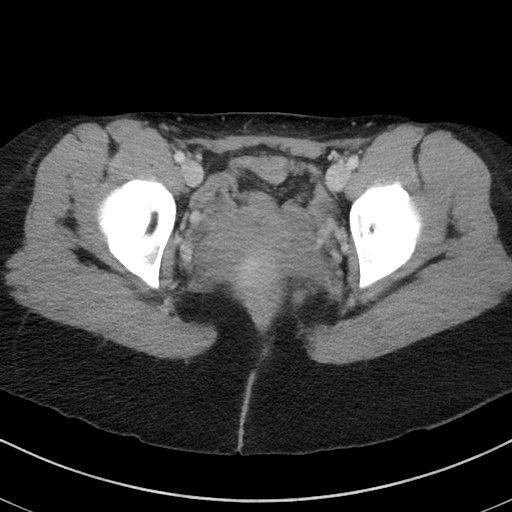
[im 28/93  soft-tissue]
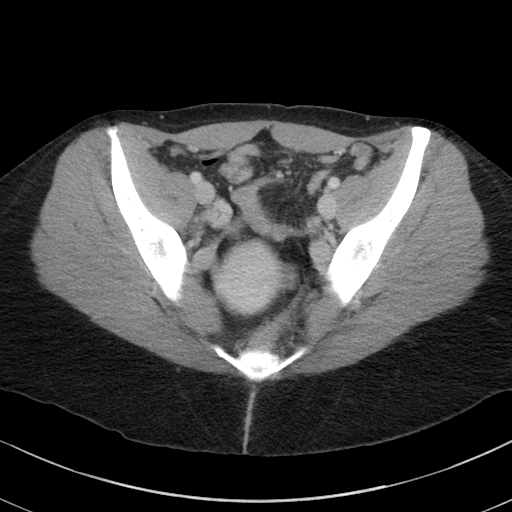
[im 33/93  soft-tissue]
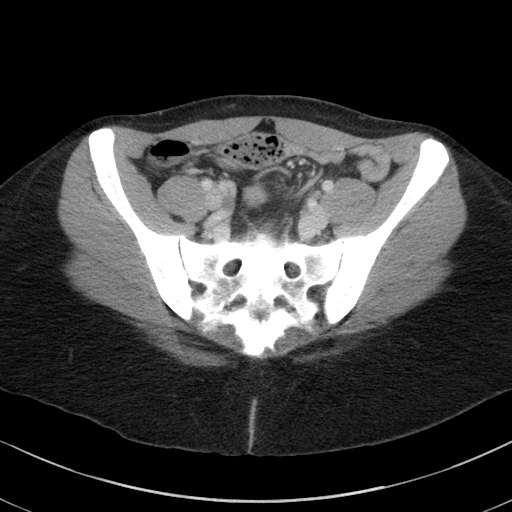
[im 42/93  soft-tissue]
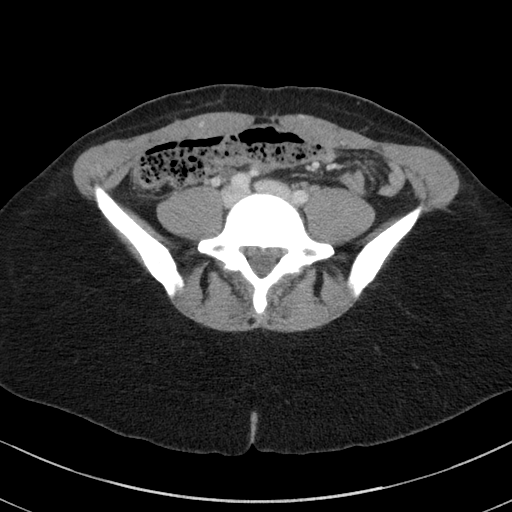
[im 47/93  soft-tissue]
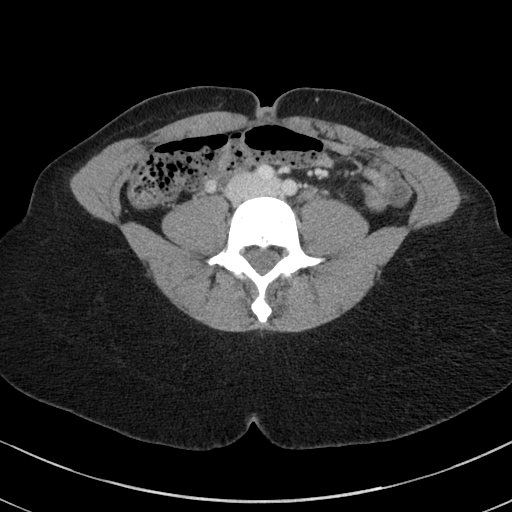
[im 51/93  soft-tissue]
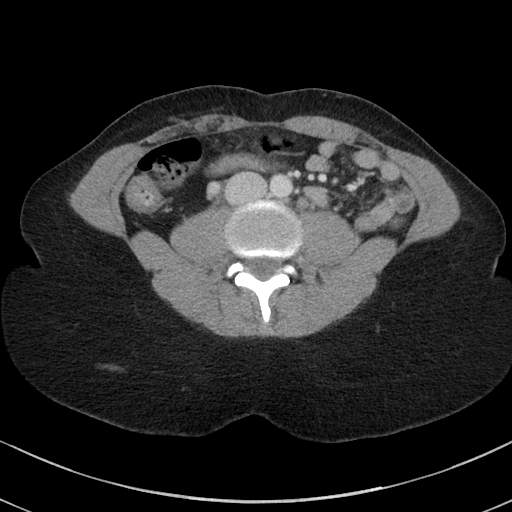
[im 60/93  soft-tissue]
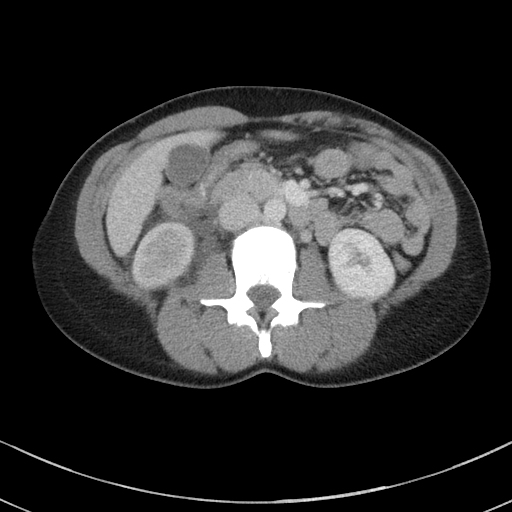
[im 60/93  bone]
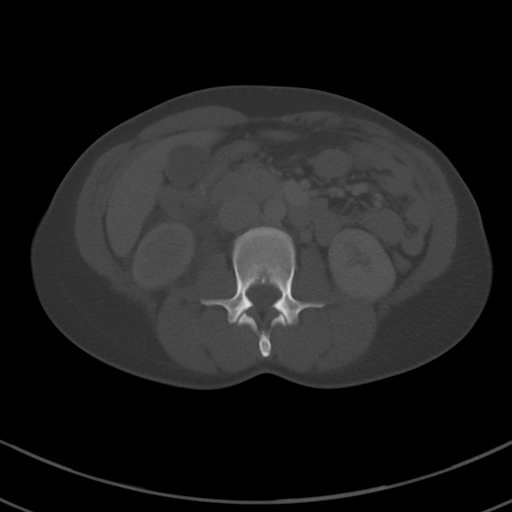
[im 65/93  soft-tissue]
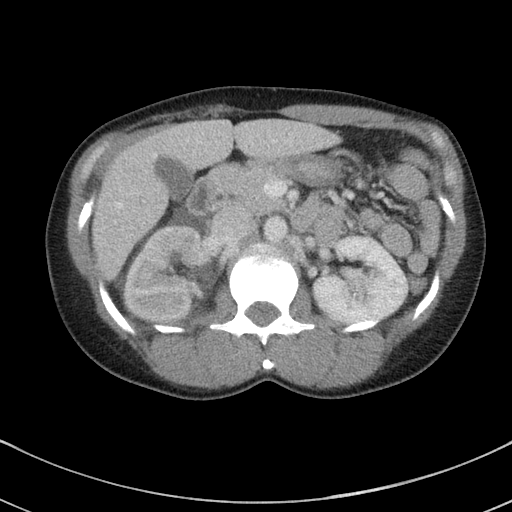
[im 74/93  soft-tissue]
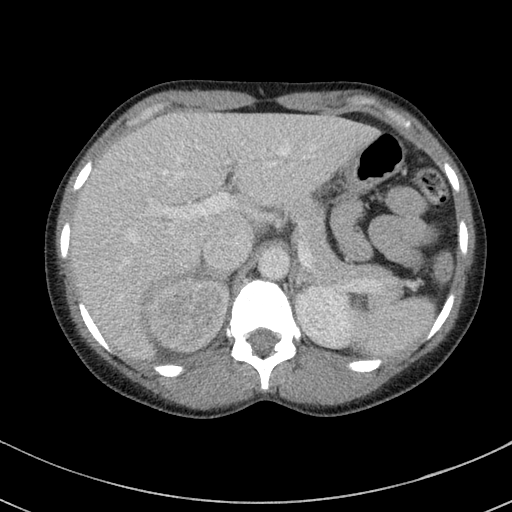
[im 79/93  soft-tissue]
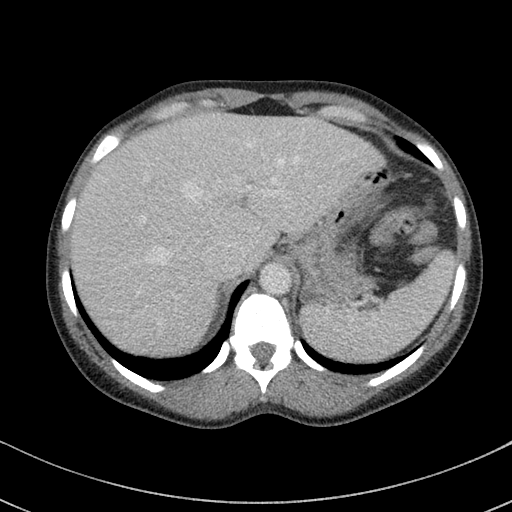
[im 88/93  soft-tissue]
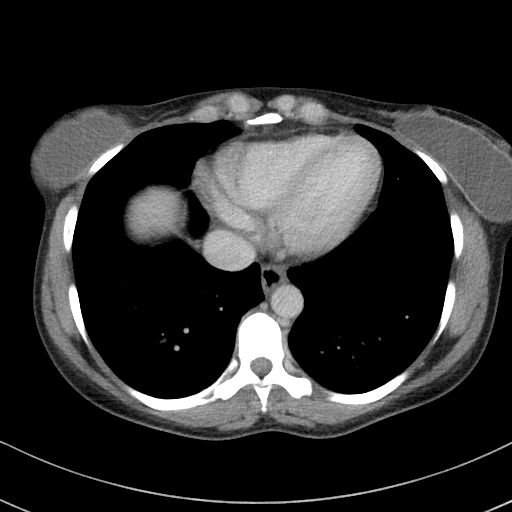

[16 of 46 positions shown; findings below may reference images not displayed]

FINDINGS: Lower chest: No acute abnormality.

Hepatobiliary: No focal liver abnormality is seen. No gallstones,
gallbladder wall thickening, or biliary dilatation.

Pancreas: Unremarkable. No pancreatic ductal dilatation or
surrounding inflammatory changes.

Spleen:  Within normal limits.

Adrenals/Urinary Tract: The adrenal glands are within normal limits.
The left kidney is well visualize without evidence of renal calculi.
Decreased perfusion of the right kidney is noted with multiple small
nonobstructing renal stones measuring 1-3 mm. Fullness of the right
renal collecting system is noted extending into the right ureter. At
the right UVJ there is a the 2 mm obstructing stone identified. This
is best visualized on image number 80 of series 7. The bladder is
decompressed.

Stomach/Bowel: The cecum is somewhat mobile extending towards the
midline. The appendix is within normal limits.

Vascular/Lymphatic: No significant vascular findings are present. No
enlarged abdominal or pelvic lymph nodes.

Reproductive: The uterus is within normal limits. Bilateral ovarian
follicular changes are noted.

Other: No abdominal wall hernia or abnormality. No abdominopelvic
ascites.

Musculoskeletal: No acute or significant osseous findings.
IMPRESSION: Right UVJ stone measuring 2 mm with obstructive change [REDACTED]reased
perfusion of the right kidney. Multiple nonobstructing renal calculi
are noted on the right as well.

## 2020-10-02 DIAGNOSIS — Z20822 Contact with and (suspected) exposure to covid-19: Secondary | ICD-10-CM | POA: Diagnosis not present

## 2020-10-02 DIAGNOSIS — R0981 Nasal congestion: Secondary | ICD-10-CM | POA: Diagnosis not present

## 2020-10-02 DIAGNOSIS — B349 Viral infection, unspecified: Secondary | ICD-10-CM | POA: Diagnosis not present

## 2020-10-02 DIAGNOSIS — R0982 Postnasal drip: Secondary | ICD-10-CM | POA: Diagnosis not present

## 2020-10-02 DIAGNOSIS — R059 Cough, unspecified: Secondary | ICD-10-CM | POA: Diagnosis not present

## 2020-10-04 DIAGNOSIS — J320 Chronic maxillary sinusitis: Secondary | ICD-10-CM | POA: Diagnosis not present

## 2020-10-04 DIAGNOSIS — W57XXXA Bitten or stung by nonvenomous insect and other nonvenomous arthropods, initial encounter: Secondary | ICD-10-CM | POA: Diagnosis not present

## 2020-10-04 DIAGNOSIS — R059 Cough, unspecified: Secondary | ICD-10-CM | POA: Diagnosis not present

## 2020-10-04 DIAGNOSIS — S80862A Insect bite (nonvenomous), left lower leg, initial encounter: Secondary | ICD-10-CM | POA: Diagnosis not present

## 2020-10-15 DIAGNOSIS — L818 Other specified disorders of pigmentation: Secondary | ICD-10-CM | POA: Diagnosis not present

## 2021-01-01 DIAGNOSIS — H524 Presbyopia: Secondary | ICD-10-CM | POA: Diagnosis not present

## 2021-01-01 DIAGNOSIS — H5213 Myopia, bilateral: Secondary | ICD-10-CM | POA: Diagnosis not present

## 2021-05-06 DIAGNOSIS — J0140 Acute pansinusitis, unspecified: Secondary | ICD-10-CM | POA: Diagnosis not present

## 2021-05-06 DIAGNOSIS — Z1211 Encounter for screening for malignant neoplasm of colon: Secondary | ICD-10-CM | POA: Diagnosis not present

## 2021-05-06 DIAGNOSIS — Z1159 Encounter for screening for other viral diseases: Secondary | ICD-10-CM | POA: Diagnosis not present

## 2021-05-06 DIAGNOSIS — Z0000 Encounter for general adult medical examination without abnormal findings: Secondary | ICD-10-CM | POA: Diagnosis not present

## 2021-05-12 DIAGNOSIS — J0140 Acute pansinusitis, unspecified: Secondary | ICD-10-CM | POA: Diagnosis not present

## 2021-05-12 DIAGNOSIS — Z13220 Encounter for screening for lipoid disorders: Secondary | ICD-10-CM | POA: Diagnosis not present

## 2021-05-12 DIAGNOSIS — Z1159 Encounter for screening for other viral diseases: Secondary | ICD-10-CM | POA: Diagnosis not present

## 2021-05-12 DIAGNOSIS — D509 Iron deficiency anemia, unspecified: Secondary | ICD-10-CM | POA: Diagnosis not present

## 2021-05-12 DIAGNOSIS — Z0001 Encounter for general adult medical examination with abnormal findings: Secondary | ICD-10-CM | POA: Diagnosis not present

## 2021-05-12 DIAGNOSIS — Z1329 Encounter for screening for other suspected endocrine disorder: Secondary | ICD-10-CM | POA: Diagnosis not present

## 2021-05-26 ENCOUNTER — Other Ambulatory Visit (HOSPITAL_COMMUNITY): Payer: Self-pay

## 2021-05-26 DIAGNOSIS — D509 Iron deficiency anemia, unspecified: Secondary | ICD-10-CM | POA: Diagnosis not present

## 2021-05-26 DIAGNOSIS — N920 Excessive and frequent menstruation with regular cycle: Secondary | ICD-10-CM | POA: Diagnosis not present

## 2021-05-26 DIAGNOSIS — Z803 Family history of malignant neoplasm of breast: Secondary | ICD-10-CM | POA: Diagnosis not present

## 2021-05-26 DIAGNOSIS — Z01419 Encounter for gynecological examination (general) (routine) without abnormal findings: Secondary | ICD-10-CM | POA: Diagnosis not present

## 2021-05-26 MED ORDER — IBUPROFEN 800 MG PO TABS
800.0000 mg | ORAL_TABLET | ORAL | 8 refills | Status: DC
  Filled 2021-05-26: qty 9, 3d supply, fill #0
  Filled 2021-06-18: qty 9, 3d supply, fill #1
  Filled 2021-07-20: qty 9, 3d supply, fill #2

## 2021-06-18 ENCOUNTER — Other Ambulatory Visit (HOSPITAL_COMMUNITY): Payer: Self-pay

## 2021-06-22 ENCOUNTER — Other Ambulatory Visit (HOSPITAL_COMMUNITY): Payer: Self-pay

## 2021-07-10 ENCOUNTER — Ambulatory Visit: Payer: Self-pay | Admitting: Podiatry

## 2021-07-15 ENCOUNTER — Ambulatory Visit: Payer: Self-pay | Admitting: Podiatry

## 2021-07-16 ENCOUNTER — Ambulatory Visit: Payer: 59 | Admitting: Podiatry

## 2021-07-16 ENCOUNTER — Ambulatory Visit (INDEPENDENT_AMBULATORY_CARE_PROVIDER_SITE_OTHER): Payer: 59

## 2021-07-16 ENCOUNTER — Encounter: Payer: Self-pay | Admitting: Podiatry

## 2021-07-16 DIAGNOSIS — S99922A Unspecified injury of left foot, initial encounter: Secondary | ICD-10-CM | POA: Diagnosis not present

## 2021-07-16 DIAGNOSIS — M21619 Bunion of unspecified foot: Secondary | ICD-10-CM | POA: Diagnosis not present

## 2021-07-16 DIAGNOSIS — M21612 Bunion of left foot: Secondary | ICD-10-CM

## 2021-07-17 NOTE — Progress Notes (Signed)
Subjective:  ? ?Patient ID: Alison Thornton, female   DOB: 46 y.o.   MRN: [id number]  ? ?HPI ?Patient presents stating she hit the fifth toe on her left foot and its been very sore and she also has bunion deformity right over left that she knows she needs to get fixed in the right when needs to be done first.  Is focused today on her left foot and the pain that she is experiencing.  Patient does not smoke likes to be active ? ? ?Review of Systems  ?All other systems reviewed and are negative. ? ? ?   ?Objective:  ?Physical Exam ?Vitals and nursing note reviewed.  ?Constitutional:   ?   Appearance: She is well-developed.  ?Pulmonary:  ?   Effort: Pulmonary effort is normal.  ?Musculoskeletal:     ?   General: Normal range of motion.  ?Skin: ?   General: Skin is warm.  ?Neurological:  ?   Mental Status: She is alert.  ?  ?Neurovascular status intact muscle strength was found to be adequate range of motion is found to be within normal limits.  Patient is noted to have swelling in the left fifth digit that is moderately tender when pressed and is also noted to have large structural bunion deformity right over left that is painful when pressed.  Patient has good digital perfusion well oriented x3 ? ?   ?Assessment:  ?Traumatized left fifth digit with possibility for fracture of the toe along with structural bunion deformity right over left foot painful when pressed ? ?   ?Plan:  ?H&P reviewed condition and discussed.  At this point we are focusing on the left and I went ahead today and I reviewed her x-ray and recommended cushioning for the toe along with wider toebox.  For the right I discussed bunion correction we did not x-ray it currently but she wants to get this done and we will see her back again prior to surgery and I educated her on what would be required from a surgical perspective ? ?X-rays indicate there is mild bunion deformity left not as severe as right and I did note the fifth digit is intact no  indication of fracture of the toe ?   ? ? ?

## 2021-07-20 ENCOUNTER — Other Ambulatory Visit (HOSPITAL_COMMUNITY): Payer: Self-pay

## 2021-07-22 ENCOUNTER — Other Ambulatory Visit: Payer: Self-pay | Admitting: Podiatry

## 2021-07-22 DIAGNOSIS — M21619 Bunion of unspecified foot: Secondary | ICD-10-CM

## 2021-07-23 ENCOUNTER — Ambulatory Visit: Payer: Self-pay | Admitting: Podiatry

## 2021-07-30 DIAGNOSIS — N920 Excessive and frequent menstruation with regular cycle: Secondary | ICD-10-CM | POA: Diagnosis not present

## 2021-08-08 DIAGNOSIS — Z1231 Encounter for screening mammogram for malignant neoplasm of breast: Secondary | ICD-10-CM | POA: Diagnosis not present

## 2021-08-26 DIAGNOSIS — F331 Major depressive disorder, recurrent, moderate: Secondary | ICD-10-CM | POA: Diagnosis not present

## 2021-09-02 DIAGNOSIS — J0140 Acute pansinusitis, unspecified: Secondary | ICD-10-CM | POA: Diagnosis not present

## 2021-09-02 DIAGNOSIS — R790 Abnormal level of blood mineral: Secondary | ICD-10-CM | POA: Diagnosis not present

## 2021-09-02 DIAGNOSIS — D509 Iron deficiency anemia, unspecified: Secondary | ICD-10-CM | POA: Diagnosis not present

## 2021-09-04 ENCOUNTER — Other Ambulatory Visit (HOSPITAL_COMMUNITY): Payer: Self-pay

## 2021-09-04 MED ORDER — FLUTICASONE PROPIONATE 50 MCG/ACT NA SUSP
1.0000 | Freq: Two times a day (BID) | NASAL | 1 refills | Status: DC
  Filled 2021-09-04: qty 16, 30d supply, fill #0

## 2021-10-05 DIAGNOSIS — F331 Major depressive disorder, recurrent, moderate: Secondary | ICD-10-CM | POA: Diagnosis not present

## 2021-10-20 DIAGNOSIS — R790 Abnormal level of blood mineral: Secondary | ICD-10-CM | POA: Diagnosis not present

## 2021-10-20 DIAGNOSIS — N939 Abnormal uterine and vaginal bleeding, unspecified: Secondary | ICD-10-CM | POA: Diagnosis not present

## 2021-10-20 DIAGNOSIS — F331 Major depressive disorder, recurrent, moderate: Secondary | ICD-10-CM | POA: Diagnosis not present

## 2021-10-20 DIAGNOSIS — R61 Generalized hyperhidrosis: Secondary | ICD-10-CM | POA: Diagnosis not present

## 2021-10-20 DIAGNOSIS — R4586 Emotional lability: Secondary | ICD-10-CM | POA: Diagnosis not present

## 2021-11-10 DIAGNOSIS — F331 Major depressive disorder, recurrent, moderate: Secondary | ICD-10-CM | POA: Diagnosis not present

## 2021-12-03 DIAGNOSIS — F331 Major depressive disorder, recurrent, moderate: Secondary | ICD-10-CM | POA: Diagnosis not present

## 2022-01-13 DIAGNOSIS — F331 Major depressive disorder, recurrent, moderate: Secondary | ICD-10-CM | POA: Diagnosis not present

## 2022-01-18 DIAGNOSIS — J329 Chronic sinusitis, unspecified: Secondary | ICD-10-CM | POA: Diagnosis not present

## 2022-01-18 DIAGNOSIS — Z23 Encounter for immunization: Secondary | ICD-10-CM | POA: Diagnosis not present

## 2022-01-18 DIAGNOSIS — Z1152 Encounter for screening for COVID-19: Secondary | ICD-10-CM | POA: Diagnosis not present

## 2022-02-23 DIAGNOSIS — H524 Presbyopia: Secondary | ICD-10-CM | POA: Diagnosis not present

## 2022-02-23 DIAGNOSIS — H5213 Myopia, bilateral: Secondary | ICD-10-CM | POA: Diagnosis not present

## 2022-03-24 DIAGNOSIS — B349 Viral infection, unspecified: Secondary | ICD-10-CM | POA: Diagnosis not present

## 2022-04-02 DIAGNOSIS — J0100 Acute maxillary sinusitis, unspecified: Secondary | ICD-10-CM | POA: Diagnosis not present

## 2022-04-29 DIAGNOSIS — F331 Major depressive disorder, recurrent, moderate: Secondary | ICD-10-CM | POA: Diagnosis not present

## 2022-05-20 DIAGNOSIS — H16201 Unspecified keratoconjunctivitis, right eye: Secondary | ICD-10-CM | POA: Diagnosis not present

## 2022-06-08 ENCOUNTER — Telehealth: Payer: Commercial Managed Care - PPO | Admitting: Physician Assistant

## 2022-06-08 DIAGNOSIS — K2900 Acute gastritis without bleeding: Secondary | ICD-10-CM | POA: Diagnosis not present

## 2022-06-08 MED ORDER — FAMOTIDINE 20 MG PO TABS: 20.0000 mg | ORAL_TABLET | Freq: Two times a day (BID) | ORAL | 0 refills | Status: AC

## 2022-06-08 MED ORDER — ONDANSETRON 4 MG PO TBDP: 4.0000 mg | ORAL_TABLET | Freq: Three times a day (TID) | ORAL | 0 refills | Status: AC | PRN

## 2022-06-08 MED ORDER — SUCRALFATE 1 G PO TABS: 1.0000 g | ORAL_TABLET | Freq: Three times a day (TID) | ORAL | 0 refills | Status: AC

## 2022-06-08 NOTE — Patient Instructions (Signed)
Alison Thornton, thank you for joining Leeanne Rio, PA-C for today's virtual visit.  While this provider is not your primary care provider (PCP), if your PCP is located in our provider database this encounter information will be shared with them immediately following your visit.   [facility] account gives you access to today's visit and all your visits, tests, and labs performed at [facility] " click here if you don't have a [facility] account or go to [email or web address]  Consent: (Patient) Alison Thornton provided verbal consent for this virtual visit at the beginning of the encounter.  Current Medications: No current outpatient medications on file.   Medications ordered in this encounter:  No orders of the defined types were placed in this encounter.    *If you need refills on other medications prior to your next appointment, please contact your pharmacy*  Follow-Up: Call back or seek an in-person evaluation if the symptoms worsen or if the condition fails to improve as anticipated.  [place] [phone]  Other Instructions Please keep well-hydrated and try to rest. Stick to a liquid diet for the rest of the evening, adding in bland foods when she feels up to it. See dietary recommendations below. Take the famotidine twice daily as directed.  Use the Carafate with meals as discussed. Zofran is to use as directed to help with any nausea. Symptoms should substantially improve over the next 24 hours. If symptoms or not improving or you note any worsening symptoms at all, I want you to seek an in person evaluation ASAP.  Food Choices for Gastroesophageal Reflux Disease, Adult When you have gastroesophageal reflux disease (GERD), the foods you eat and your eating habits are very important. Choosing the right foods can help ease your discomfort. Think about working with a food expert (dietitian) to help you make good  choices. What are tips for following this plan? Reading food labels Look for foods that are low in saturated fat. Foods that may help with your symptoms include: Foods that have less than 5% of daily value (DV) of fat. Foods that have 0 grams of trans fat. Cooking Do not fry your food. Cook your food by baking, steaming, grilling, or broiling. These are all methods that do not need a lot of fat for cooking. To add flavor, try to use herbs that are low in spice and acidity. Meal planning  Choose healthy foods that are low in fat, such as: Fruits and vegetables. Whole grains. Low-fat dairy products. Lean meats, fish, and poultry. Eat small meals often instead of eating 3 large meals each day. Eat your meals slowly in a place where you are relaxed. Avoid bending over or lying down until 2-3 hours after eating. Limit high-fat foods such as fatty meats or fried foods. Limit your intake of fatty foods, such as oils, butter, and shortening. Avoid the following as told by your doctor: Foods that cause symptoms. These may be different for different people. Keep a food diary to keep track of foods that cause symptoms. Alcohol. Drinking a lot of liquid with meals. Eating meals during the 2-3 hours before bed. Lifestyle Stay at a healthy weight. Ask your doctor what weight is healthy for you. If you need to lose weight, work with your doctor to do so safely. Exercise for at least 30 minutes on 5 or more days each week, or as told by your doctor. Wear loose-fitting clothes. Do not smoke or use  any products that contain nicotine or tobacco. If you need help quitting, ask your doctor. Sleep with the head of your bed higher than your feet. Use a wedge under the mattress or blocks under the bed frame to raise the head of the bed. Chew sugar-free gum after meals. What foods should eat?  Eat a healthy, well-balanced diet of fruits, vegetables, whole grains, low-fat dairy products, lean meats, fish,  and poultry. Each person is different. Foods that may cause symptoms in one person may not cause any symptoms in another person. Work with your doctor to find foods that are safe for you. The items listed above may not be a complete list of what you can eat and drink. Contact a food expert for more options. What foods should I avoid? Limiting some of these foods may help in managing the symptoms of GERD. Everyone is different. Talk with a food expert or your doctor to help you find the exact foods to avoid, if any. Fruits Any fruits prepared with added fat. Any fruits that cause symptoms. For some people, this may include citrus fruits, such as oranges, grapefruit, pineapple, and lemons. Vegetables Deep-fried vegetables. [place] fries. Any vegetables prepared with added fat. Any vegetables that cause symptoms. For some people, this may include tomatoes and tomato products, chili peppers, onions and garlic, and horseradish. Grains Pastries or quick breads with added fat. Meats and other proteins High-fat meats, such as fatty beef or pork, hot dogs, ribs, ham, sausage, salami, and bacon. Fried meat or protein, including fried fish and fried chicken. Nuts and nut butters, in large amounts. Dairy Whole milk and chocolate milk. Sour cream. Cream. Ice cream. Cream cheese. Milkshakes. Fats and oils Butter. Margarine. Shortening. Ghee. Beverages Coffee and tea, with or without caffeine. Carbonated beverages. Sodas. Energy drinks. Fruit juice made with acidic fruits, such as orange or grapefruit. Tomato juice. Alcoholic drinks. Sweets and desserts Chocolate and cocoa. Donuts. Seasonings and condiments Pepper. Peppermint and spearmint. Added salt. Any condiments, herbs, or seasonings that cause symptoms. For some people, this may include curry, hot sauce, or vinegar-based salad dressings. The items listed above may not be a complete list of what you should not eat and drink. Contact a food expert for  more options. Questions to ask your doctor Diet and lifestyle changes are often the first steps that are taken to manage symptoms of GERD. If diet and lifestyle changes do not help, talk with your doctor about taking medicines. Where to find more information International Foundation for Gastrointestinal Disorders: aboutgerd.org Summary When you have GERD, food and lifestyle choices are very important in easing your symptoms. Eat small meals often instead of 3 large meals a day. Eat your meals slowly and in a place where you are relaxed. Avoid bending over or lying down until 2-3 hours after eating. Limit high-fat foods such as fatty meats or fried foods. This information is not intended to replace advice given to you by your health care provider. Make sure you discuss any questions you have with your health care provider. Document Revised: 09/17/2019 Document Reviewed: 09/17/2019 Elsevier Patient Education  2023 [place] Diet A bland diet may consist of soft foods or foods that are not high in fat or are not greasy, acidic, or spicy. Avoiding certain foods may cause less irritation to your mouth, throat, stomach, or gastrointestinal tract. Avoiding certain foods may make you feel better. Everyone's tolerances are different. A bland diet should be based on what you can  tolerate and what may cause discomfort. What is my plan? Your health care provider or dietitian may recommend specific changes to your diet to treat your symptoms. These changes may include: Eating small meals frequently. Cooking food until it is soft enough to chew easily. Taking the time to chew your food thoroughly, so it is easy to swallow and digest. Avoiding foods that cause you discomfort. These may include spicy food, fried food, greasy foods, hard-to-chew foods, or citrus fruits and juices. Drinking slowly. What are tips for following this plan? Reading food labels To reduce fiber intake, look for food  labels that say "whole," such as whole wheat or whole grain. Shopping Avoid food items that may have nuts or seeds. Avoid vegetables that may make you gassy or have a tough texture, such as broccoli, cauliflower, or corn. Cooking Cook foods thoroughly so they have a soft texture. Meal planning Make sure you include foods from all food groups to eat a balanced diet. Eat a variety of types of foods. Eat foods and drink beverages that do not cause you discomfort. These may include soups and broths with cooked meats, pasta, and vegetables. Lifestyle Sit up after meals, avoid tight clothing, and take time to eat and chew your food slowly. Ask your health care provider whether you should take dietary supplements. General information Mildly season your foods. Some seasonings, such as cayenne pepper, vinegar, or hot sauce, may cause irritation. The foods, beverages, or seasonings to avoid should be based on individual tolerance. What foods should I eat? Fruits Canned or cooked fruit such as peaches, pears, or applesauce. Bananas. Vegetables Well-cooked vegetables. Canned or cooked vegetables such as carrots, green beans, beets, or spinach. Mashed or boiled potatoes. Grains  Hot cereals, such as cream of wheat and processed oatmeal. Rice. Bread, crackers, pasta, or tortillas made from refined white flour. Meats and other proteins  Eggs. Creamy peanut butter or other nut butters. Lean, well-cooked tender meats, such as beef, pork, chicken, or fish. Dairy Low-fat dairy products such as milk, cottage cheese, or yogurt. Beverages  Water. Herbal tea. Apple juice. Fats and oils Mild salad dressings. Canola or olive oil. Sweets and desserts Low-fat pudding, custard, or ice cream. Fruit gelatin. The items listed above may not be a complete list of foods and beverages you can eat. Contact a dietitian for more information. What foods should I avoid? Fruits Citrus fruits, such as oranges and  grapefruit. Fruits with a stringy texture. Fruits that have lots of seeds, such as kiwi or strawberries. Dried fruits. Vegetables Raw, uncooked vegetables. Salads. Grains Whole grain breads, muffins, and cereals. Meats and other proteins Tough, fibrous meats. Highly seasoned meat such as corned beef, smoked meats, or fish. Processed high-fat meats such as brats, hot dogs, or sausage. Dairy Full-fat dairy foods such as ice cream and cheese. Beverages Caffeinated drinks. Alcohol. Seasonings and condiments Strongly flavored seasonings or condiments. Hot sauce. Salsa. Other foods Spicy foods. Fried or greasy foods. Sour foods, such as pickled or fermented foods like sauerkraut. Foods high in fiber. The items listed above may not be a complete list of foods and beverages you should avoid. Contact a dietitian for more information. Summary A bland diet should be based on individual tolerance. It may consist of foods that are soft textured and do not have a lot of fat, fiber, acid, or seasonings. A bland diet may be recommended because avoiding certain foods, beverages, or spices may make you feel better. This information is not intended  to replace advice given to you by your health care provider. Make sure you discuss any questions you have with your health care provider. Document Revised: 01/26/2021 Document Reviewed: 01/26/2021 Elsevier Patient Education  [place].   If you have been instructed to have an in-person evaluation today at a local Urgent Care facility, please use the link below. It will take you to a list of all of our available [facility], including address, phone number and hours of operation. Please do not delay care.  [facility]  If you or a family member do not have a primary care provider, use the link below to schedule a visit and establish care. When you choose a [facility] primary care physician or advanced practice provider, you gain  a long-term partner in health. Find a Primary Care Provider  Learn more about [facility]'s in-office and virtual care options: [facility]

## 2022-06-08 NOTE — Progress Notes (Signed)
Virtual Visit Consent   [id number], you are scheduled for a virtual visit with a [facility] provider today. Just as with appointments in the office, your consent must be obtained to participate. Your consent will be active for this visit and any virtual visit you may have with one of our providers in the next 365 days. If you have a MyChart account, a copy of this consent can be sent to you electronically.  As this is a virtual visit, video technology does not allow for your provider to perform a traditional examination. This may limit your provider's ability to fully assess your condition. If your provider identifies any concerns that need to be evaluated in person or the need to arrange testing (such as labs, EKG, etc.), we will make arrangements to do so. Although advances in technology are sophisticated, we cannot ensure that it will always work on either your end or our end. If the connection with a video visit is poor, the visit may have to be switched to a telephone visit. With either a video or telephone visit, we are not always able to ensure that we have a secure connection.  By engaging in this virtual visit, you consent to the provision of healthcare and authorize for your insurance to be billed (if applicable) for the services provided during this visit. Depending on your insurance coverage, you may receive a charge related to this service.  I need to obtain your verbal consent now. Are you willing to proceed with your visit today? Alison Thornton has provided verbal consent on 06/08/2022 for a virtual visit (video or telephone). Leeanne Rio, [place]  Date: 06/08/2022 4:38 PM  Virtual Visit via Video Note   I, Leeanne Rio, connected with  Alison Thornton  ([id number], 04-03-1975) on 06/08/22 at  4:15 PM EDT by a video-enabled telemedicine application and verified that I am speaking with the correct person using two identifiers.  Location: Patient: Virtual Visit  Location Patient: Home Provider: Virtual Visit Location Provider: Home Office   I discussed the limitations of evaluation and management by telemedicine and the availability of in person appointments. The patient expressed understanding and agreed to proceed.    History of Present Illness: Alison Thornton is a 47 y.o. who identifies as a female who was assigned female at birth, and is being seen today for over 24 hours of upper GI symptoms.  Notes started yesterday afternoon with some nausea and heartburn/indigestion.  States this seemed to initially ease up but then she developed some upper abdominal cramping and increased gaseousness.  Has now noted a recurrence of the heartburn sensation.  Denies fever, chills.  Denies chest pain or shortness of breath.  Denies any significant changes in bowel habits, but did note some loose caliber stool this morning.  Denies melena, hematochezia or tenesmus.  Some associated anorexia but without vomiting.  Denies recent travel or sick contact.  Denies recent NSAID use.  Denies alcohol consumption or tobacco use.    HPI: HPI  Problems: There are no problems to display for this patient.   Allergies:  Allergies  Allergen Reactions   Codeine Itching and Nausea And Vomiting   Medications:  Current Outpatient Medications:    famotidine (PEPCID) 20 MG tablet, Take 1 tablet (20 mg total) by mouth 2 (two) times daily., Disp: 30 tablet, Rfl: 0   ondansetron (ZOFRAN-ODT) 4 MG disintegrating tablet, Take 1 tablet (4 mg total) by mouth every 8 (eight) hours as  needed for nausea or vomiting., Disp: 20 tablet, Rfl: 0   sucralfate (CARAFATE) 1 g tablet, Take 1 tablet (1 g total) by mouth 4 (four) times daily -  with meals and at bedtime., Disp: 30 tablet, Rfl: 0  Observations/Objective: Patient is well-developed, well-nourished in no acute distress.  Resting comfortably  at home.  Head is normocephalic, atraumatic.  No labored breathing.  Speech is clear and  coherent with logical content.  Patient is alert and oriented at baseline.   Assessment and Plan: 1. Acute gastritis without hemorrhage, unspecified gastritis type - famotidine (PEPCID) 20 MG tablet; Take 1 tablet (20 mg total) by mouth 2 (two) times daily.  Dispense: 30 tablet; Refill: 0 - sucralfate (CARAFATE) 1 g tablet; Take 1 tablet (1 g total) by mouth 4 (four) times daily -  with meals and at bedtime.  Dispense: 30 tablet; Refill: 0 - ondansetron (ZOFRAN-ODT) 4 MG disintegrating tablet; Take 1 tablet (4 mg total) by mouth every 8 (eight) hours as needed for nausea or vomiting.  Dispense: 20 tablet; Refill: 0  Concern for mild gastritis with unknown cause.  She is afebrile, with anorexia and nausea but no emesis.  No substantial bowel changes.  Still with noted heartburn and indigestion over the past greater than 24 hours.  Symptoms are nonexertional and she has no cardiac history so lower concern of any atypical cardiac symptoms.  Supportive measures and OTC medications reviewed along with dietary recommendations.  Will start famotidine 20 mg twice daily along with Zofran and a short prescription of Carafate.  She needs a follow-up in person with her primary care within the next few days as long as symptoms are steadily improving/resolving.  If symptoms or not improving overnight into tomorrow morning with what has been given, or there are any worsening symptoms at all, she is to seek an in person evaluation ASAP.  Work note provided.  Follow Up Instructions: I discussed the assessment and treatment plan with the patient. The patient was provided an opportunity to ask questions and all were answered. The patient agreed with the plan and demonstrated an understanding of the instructions.  A copy of instructions were sent to the patient via MyChart unless otherwise noted below.   The patient was advised to call back or seek an in-person evaluation if the symptoms worsen or if the condition fails  to improve as anticipated.  Time:  I spent 10 minutes with the patient via telehealth technology discussing the above problems/concerns.    Leeanne Rio, PA-C

## 2022-07-22 DIAGNOSIS — F331 Major depressive disorder, recurrent, moderate: Secondary | ICD-10-CM | POA: Diagnosis not present

## 2022-12-23 DIAGNOSIS — E66811 Obesity, class 1: Secondary | ICD-10-CM | POA: Diagnosis not present

## 2022-12-23 DIAGNOSIS — J302 Other seasonal allergic rhinitis: Secondary | ICD-10-CM | POA: Diagnosis not present

## 2022-12-23 DIAGNOSIS — E7800 Pure hypercholesterolemia, unspecified: Secondary | ICD-10-CM | POA: Diagnosis not present

## 2022-12-23 DIAGNOSIS — Z6832 Body mass index (BMI) 32.0-32.9, adult: Secondary | ICD-10-CM | POA: Diagnosis not present

## 2022-12-23 DIAGNOSIS — Z0000 Encounter for general adult medical examination without abnormal findings: Secondary | ICD-10-CM | POA: Diagnosis not present

## 2022-12-23 DIAGNOSIS — Z1159 Encounter for screening for other viral diseases: Secondary | ICD-10-CM | POA: Diagnosis not present

## 2022-12-23 DIAGNOSIS — Z862 Personal history of diseases of the blood and blood-forming organs and certain disorders involving the immune mechanism: Secondary | ICD-10-CM | POA: Diagnosis not present

## 2022-12-23 DIAGNOSIS — I83812 Varicose veins of left lower extremities with pain: Secondary | ICD-10-CM | POA: Diagnosis not present

## 2022-12-23 DIAGNOSIS — Z1211 Encounter for screening for malignant neoplasm of colon: Secondary | ICD-10-CM | POA: Diagnosis not present

## 2022-12-23 DIAGNOSIS — E6609 Other obesity due to excess calories: Secondary | ICD-10-CM | POA: Diagnosis not present

## 2022-12-25 DIAGNOSIS — Z1231 Encounter for screening mammogram for malignant neoplasm of breast: Secondary | ICD-10-CM | POA: Diagnosis not present

## 2023-03-03 DIAGNOSIS — H52223 Regular astigmatism, bilateral: Secondary | ICD-10-CM | POA: Diagnosis not present

## 2023-03-03 DIAGNOSIS — H5213 Myopia, bilateral: Secondary | ICD-10-CM | POA: Diagnosis not present

## 2023-03-03 DIAGNOSIS — H524 Presbyopia: Secondary | ICD-10-CM | POA: Diagnosis not present

## 2023-03-21 DIAGNOSIS — J029 Acute pharyngitis, unspecified: Secondary | ICD-10-CM | POA: Diagnosis not present

## 2023-03-21 DIAGNOSIS — J069 Acute upper respiratory infection, unspecified: Secondary | ICD-10-CM | POA: Diagnosis not present

## 2023-04-13 DIAGNOSIS — R42 Dizziness and giddiness: Secondary | ICD-10-CM | POA: Diagnosis not present

## 2023-04-13 DIAGNOSIS — D509 Iron deficiency anemia, unspecified: Secondary | ICD-10-CM | POA: Diagnosis not present

## 2023-04-13 DIAGNOSIS — E6609 Other obesity due to excess calories: Secondary | ICD-10-CM | POA: Diagnosis not present

## 2023-04-13 DIAGNOSIS — Z6832 Body mass index (BMI) 32.0-32.9, adult: Secondary | ICD-10-CM | POA: Diagnosis not present

## 2023-04-13 DIAGNOSIS — E66811 Obesity, class 1: Secondary | ICD-10-CM | POA: Diagnosis not present

## 2023-06-01 DIAGNOSIS — R0981 Nasal congestion: Secondary | ICD-10-CM | POA: Diagnosis not present

## 2023-06-01 DIAGNOSIS — R0689 Other abnormalities of breathing: Secondary | ICD-10-CM | POA: Diagnosis not present

## 2023-06-01 DIAGNOSIS — J0101 Acute recurrent maxillary sinusitis: Secondary | ICD-10-CM | POA: Diagnosis not present

## 2023-06-21 DIAGNOSIS — F411 Generalized anxiety disorder: Secondary | ICD-10-CM | POA: Diagnosis not present

## 2023-08-04 DIAGNOSIS — F411 Generalized anxiety disorder: Secondary | ICD-10-CM | POA: Diagnosis not present

## 2023-08-25 DIAGNOSIS — F411 Generalized anxiety disorder: Secondary | ICD-10-CM | POA: Diagnosis not present

## 2023-09-08 DIAGNOSIS — Z113 Encounter for screening for infections with a predominantly sexual mode of transmission: Secondary | ICD-10-CM | POA: Diagnosis not present

## 2023-09-08 DIAGNOSIS — Z6832 Body mass index (BMI) 32.0-32.9, adult: Secondary | ICD-10-CM | POA: Diagnosis not present

## 2023-09-08 DIAGNOSIS — D509 Iron deficiency anemia, unspecified: Secondary | ICD-10-CM | POA: Diagnosis not present

## 2023-09-08 DIAGNOSIS — E66811 Obesity, class 1: Secondary | ICD-10-CM | POA: Diagnosis not present

## 2023-09-08 DIAGNOSIS — E6609 Other obesity due to excess calories: Secondary | ICD-10-CM | POA: Diagnosis not present

## 2023-09-08 DIAGNOSIS — N951 Menopausal and female climacteric states: Secondary | ICD-10-CM | POA: Diagnosis not present

## 2023-10-07 DIAGNOSIS — F411 Generalized anxiety disorder: Secondary | ICD-10-CM | POA: Diagnosis not present

## 2023-10-11 DIAGNOSIS — F411 Generalized anxiety disorder: Secondary | ICD-10-CM | POA: Diagnosis not present

## 2023-10-18 DIAGNOSIS — F411 Generalized anxiety disorder: Secondary | ICD-10-CM | POA: Diagnosis not present

## 2023-10-25 DIAGNOSIS — F411 Generalized anxiety disorder: Secondary | ICD-10-CM | POA: Diagnosis not present

## 2023-11-03 DIAGNOSIS — F411 Generalized anxiety disorder: Secondary | ICD-10-CM | POA: Diagnosis not present

## 2023-11-08 DIAGNOSIS — F411 Generalized anxiety disorder: Secondary | ICD-10-CM | POA: Diagnosis not present

## 2023-11-15 DIAGNOSIS — F411 Generalized anxiety disorder: Secondary | ICD-10-CM | POA: Diagnosis not present

## 2023-11-23 DIAGNOSIS — F411 Generalized anxiety disorder: Secondary | ICD-10-CM | POA: Diagnosis not present

## 2023-11-28 DIAGNOSIS — F411 Generalized anxiety disorder: Secondary | ICD-10-CM | POA: Diagnosis not present

## 2023-12-12 DIAGNOSIS — F411 Generalized anxiety disorder: Secondary | ICD-10-CM | POA: Diagnosis not present

## 2023-12-20 DIAGNOSIS — F411 Generalized anxiety disorder: Secondary | ICD-10-CM | POA: Diagnosis not present

## 2023-12-27 DIAGNOSIS — L819 Disorder of pigmentation, unspecified: Secondary | ICD-10-CM | POA: Diagnosis not present

## 2024-01-02 DIAGNOSIS — F411 Generalized anxiety disorder: Secondary | ICD-10-CM | POA: Diagnosis not present

## 2024-01-11 DIAGNOSIS — F411 Generalized anxiety disorder: Secondary | ICD-10-CM | POA: Diagnosis not present

## 2024-02-20 DIAGNOSIS — B9689 Other specified bacterial agents as the cause of diseases classified elsewhere: Secondary | ICD-10-CM | POA: Diagnosis not present

## 2024-02-20 DIAGNOSIS — J019 Acute sinusitis, unspecified: Secondary | ICD-10-CM | POA: Diagnosis not present

## 2024-04-04 ENCOUNTER — Other Ambulatory Visit: Payer: Self-pay | Admitting: Medical Genetics

## 2024-04-20 ENCOUNTER — Other Ambulatory Visit: Payer: Self-pay

## 2024-04-24 LAB — SURGICAL PATHOLOGY
